# Patient Record
Sex: Female | Born: 1990 | Race: White | Hispanic: No | Marital: Single | Smoking: Current every day smoker
Health system: Southern US, Community
[De-identification: ages and names within clinical notes are randomized; demographics above are authoritative.]

## PROBLEM LIST (undated history)

## (undated) DIAGNOSIS — S82891A Other fracture of right lower leg, initial encounter for closed fracture: Secondary | ICD-10-CM

## (undated) DIAGNOSIS — F53 Postpartum depression: Secondary | ICD-10-CM

## (undated) DIAGNOSIS — O99345 Other mental disorders complicating the puerperium: Secondary | ICD-10-CM

## (undated) HISTORY — PX: TYMPANOSTOMY TUBE PLACEMENT: SHX32

## (undated) HISTORY — DX: Other mental disorders complicating the puerperium: O99.345

## (undated) HISTORY — PX: TONSILLECTOMY: SHX5217

## (undated) HISTORY — DX: Postpartum depression: F53.0

## (undated) HISTORY — PX: ANKLE FRACTURE SURGERY: SHX122

## (undated) HISTORY — DX: Other fracture of right lower leg, initial encounter for closed fracture: S82.891A

---
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
## 2010-05-14 ENCOUNTER — Observation Stay: Payer: Self-pay

## 2010-08-08 ENCOUNTER — Inpatient Hospital Stay: Payer: Self-pay

## 2011-08-12 ENCOUNTER — Emergency Department: Payer: Self-pay | Admitting: *Deleted

## 2011-08-12 LAB — PREGNANCY, URINE: Pregnancy Test, Urine: NEGATIVE m[IU]/mL

## 2011-08-12 LAB — URINALYSIS, COMPLETE
Ketone: NEGATIVE
Ph: 5 (ref 4.5–8.0)
Protein: NEGATIVE
Specific Gravity: 1.021 (ref 1.003–1.030)

## 2011-08-12 LAB — WET PREP, GENITAL

## 2012-08-20 IMAGING — US US PELV - US TRANSVAGINAL
1 series · 14 of 25 positions shown · non-contrast
Comparison: none

REASON FOR EXAM: llq pain
COMMENTS:   LMP: IUD

[Series 1: us pelv - us transvaginal · 0.26mm/px · 14 of 101 slices shown]
[im 1/101]
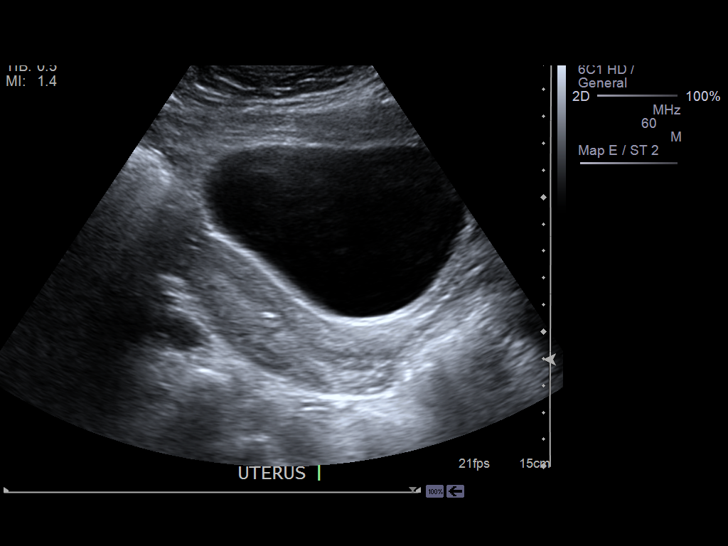
[im 9/101]
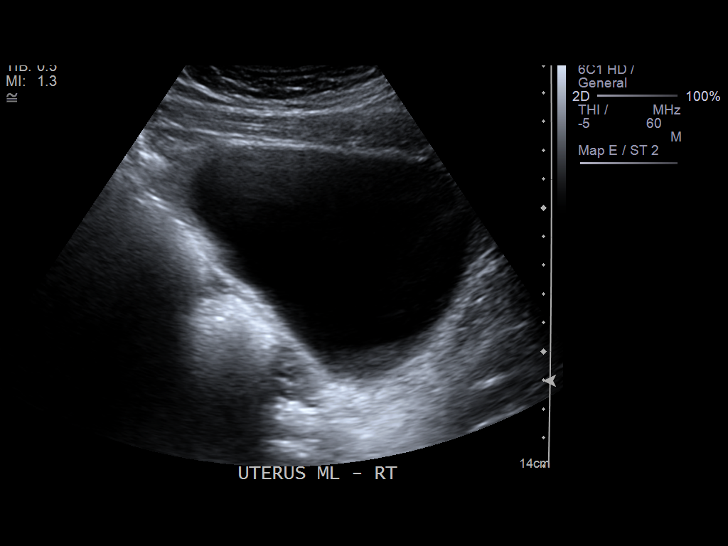
[im 17/101]
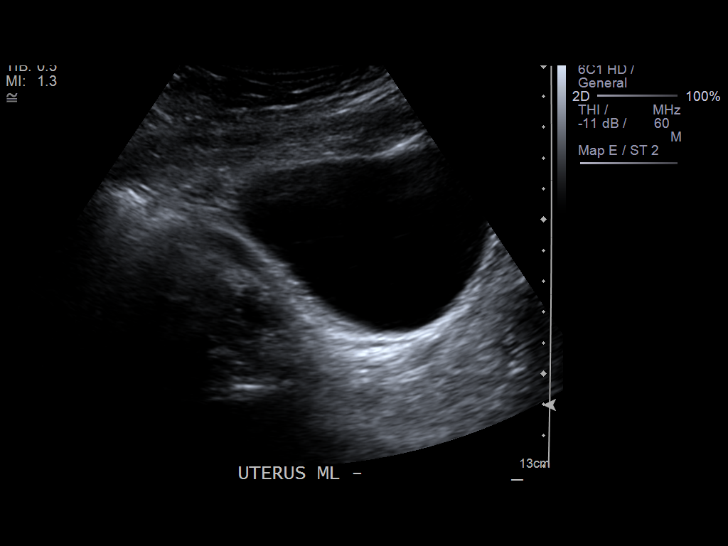
[im 26/101]
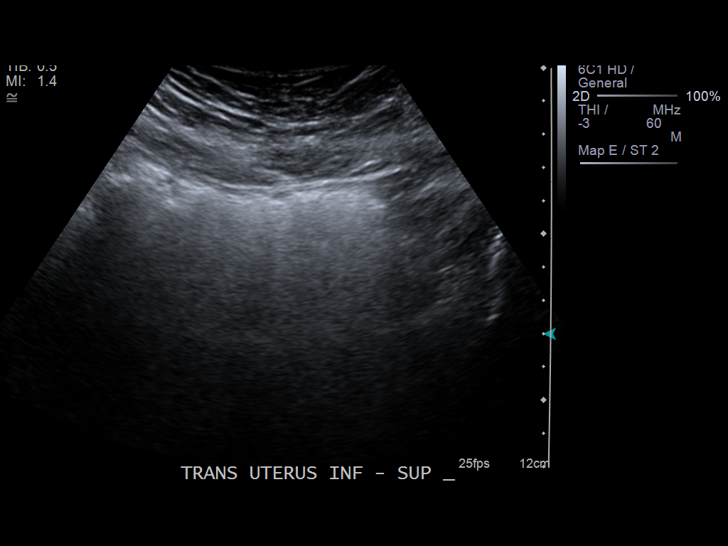
[im 34/101]
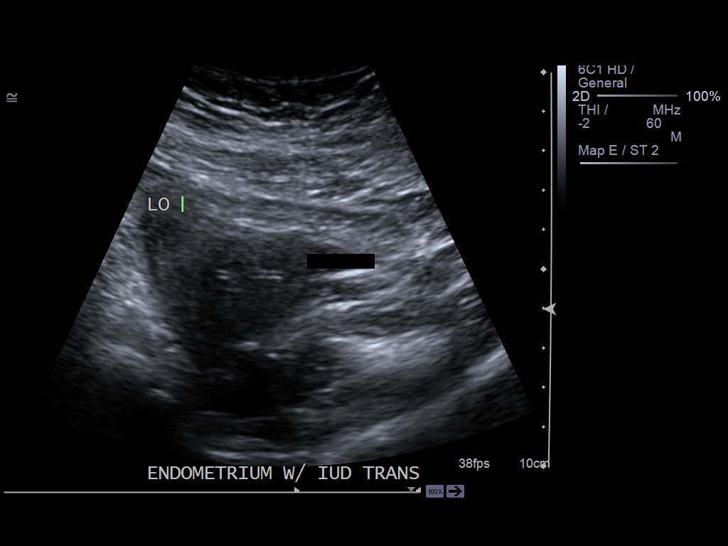
[im 38/101]
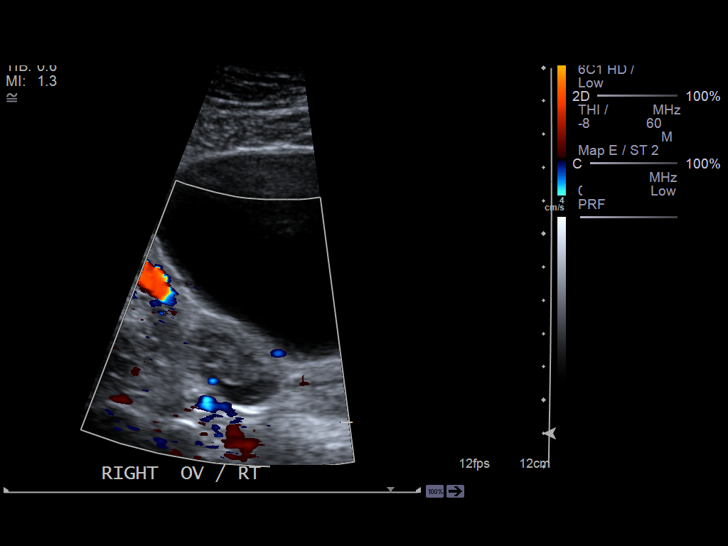
[im 46/101]
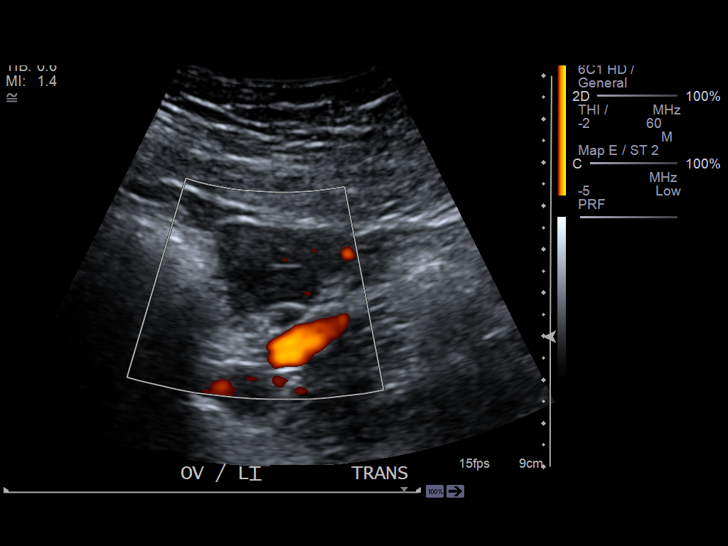
[im 55/101]
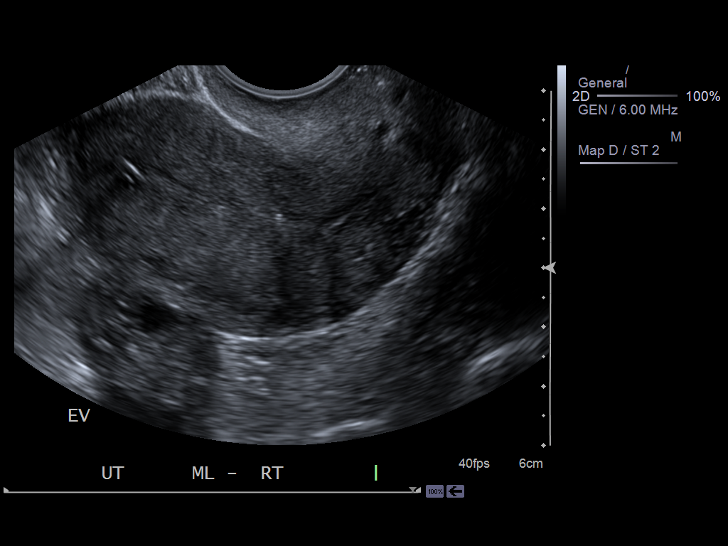
[im 63/101]
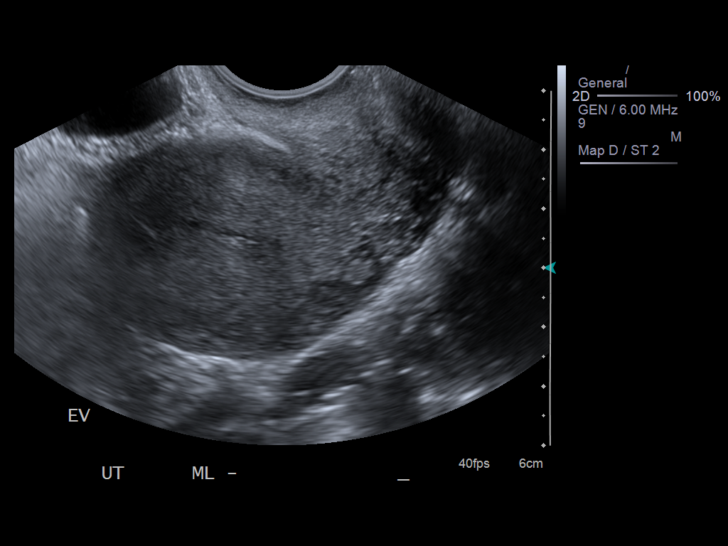
[im 67/101]
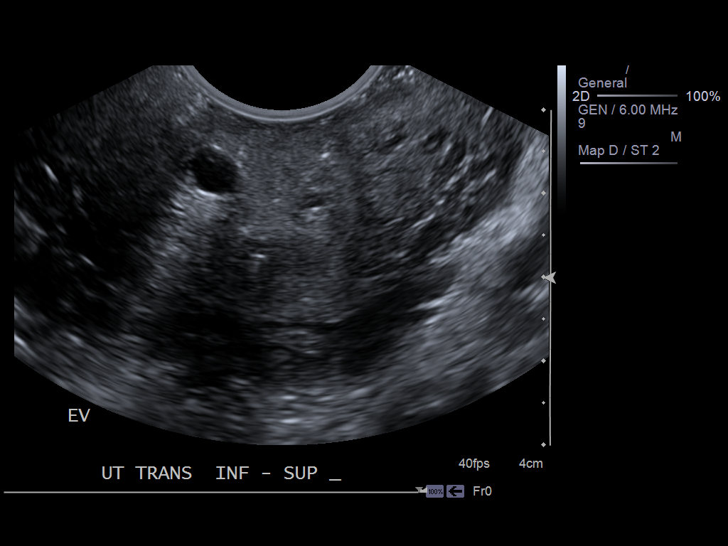
[im 76/101]
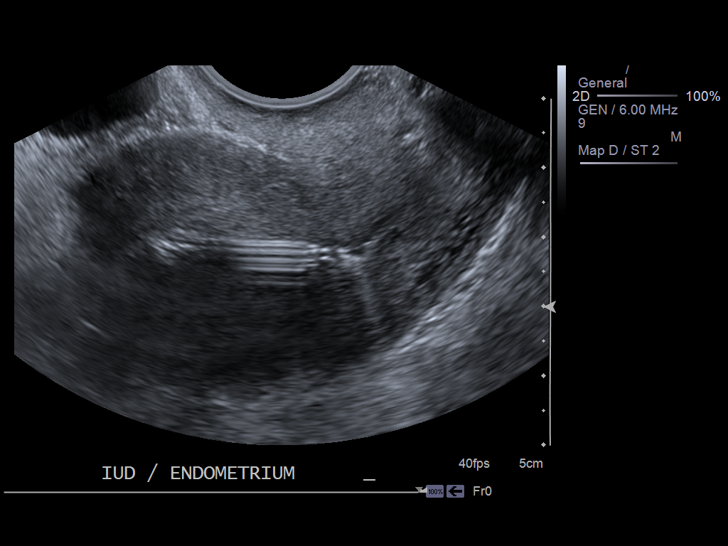
[im 84/101]
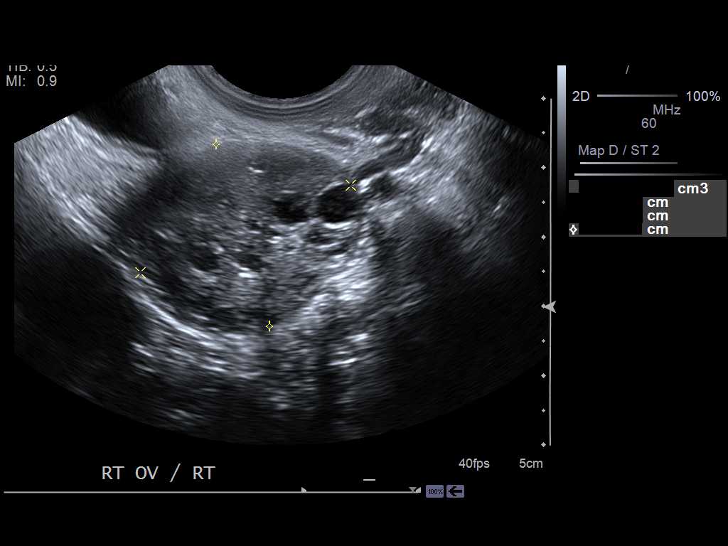
[im 92/101]
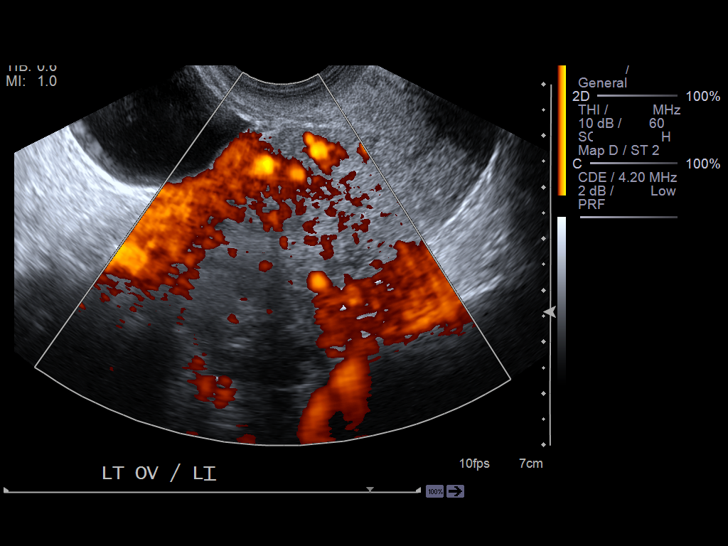
[im 101/101]
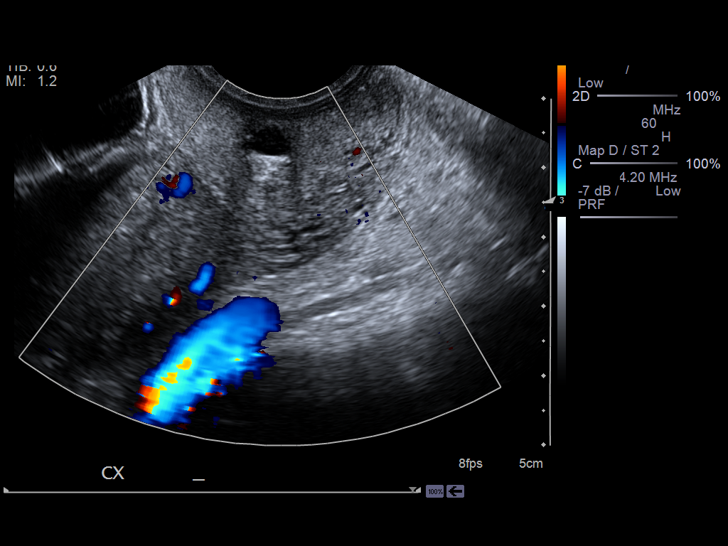

[14 of 25 positions shown; findings below may reference images not displayed]

PROCEDURE:     US  - US PELVIS EXAM W/TRANSVAGINAL  - August 12, 2011  [DATE]

RESULT:     The uterus exhibits normal echotexture and contains an IUD. The
uterus measures 8.8 x 4.6 x 2.6 cm. The endometrial stripe measures just
over 2 mm in thickness. There is no free fluid in the cul-de-sac. The right
ovary measures 3.5 x 3.2 x 1.6 cm. The left ovary measures 3.1 x 2.9 x 2 cm.
Survey views of the kidneys exhibit no evidence of obstruction.
IMPRESSION: 1. The uterus contains an IUD and is normal in echotexture and contour
2. The ovaries exhibit normal echotexture and vascularity. No adnexal masses
are demonstrated.

## 2013-07-07 ENCOUNTER — Emergency Department: Payer: Self-pay | Admitting: Emergency Medicine

## 2013-07-07 LAB — URINALYSIS, COMPLETE
BILIRUBIN, UR: NEGATIVE
Glucose,UR: NEGATIVE mg/dL (ref 0–75)
KETONE: NEGATIVE
NITRITE: NEGATIVE
PH: 6 (ref 4.5–8.0)
PROTEIN: NEGATIVE
RBC,UR: 1 /HPF (ref 0–5)
SPECIFIC GRAVITY: 1.023 (ref 1.003–1.030)
Squamous Epithelial: 13
WBC UR: 4 /HPF (ref 0–5)

## 2013-07-07 LAB — CBC
HCT: 40.2 % (ref 35.0–47.0)
HGB: 13.4 g/dL (ref 12.0–16.0)
MCH: 30.2 pg (ref 26.0–34.0)
MCHC: 33.2 g/dL (ref 32.0–36.0)
MCV: 91 fL (ref 80–100)
Platelet: 279 10*3/uL (ref 150–440)
RBC: 4.42 10*6/uL (ref 3.80–5.20)
RDW: 13.3 % (ref 11.5–14.5)
WBC: 14.2 10*3/uL — ABNORMAL HIGH (ref 3.6–11.0)

## 2013-07-07 LAB — HCG, QUANTITATIVE, PREGNANCY: Beta Hcg, Quant.: 100923 m[IU]/mL — ABNORMAL HIGH

## 2013-11-20 ENCOUNTER — Observation Stay: Payer: Self-pay | Admitting: Emergency Medicine

## 2014-02-17 ENCOUNTER — Inpatient Hospital Stay: Payer: Self-pay | Admitting: Obstetrics and Gynecology

## 2014-02-17 LAB — CBC WITH DIFFERENTIAL/PLATELET
BASOS ABS: 0.1 10*3/uL (ref 0.0–0.1)
Basophil %: 0.4 %
EOS ABS: 0.1 10*3/uL (ref 0.0–0.7)
Eosinophil %: 0.6 %
HCT: 39.7 % (ref 35.0–47.0)
HGB: 12.9 g/dL (ref 12.0–16.0)
LYMPHS PCT: 16.3 %
Lymphocyte #: 3.1 10*3/uL (ref 1.0–3.6)
MCH: 28.9 pg (ref 26.0–34.0)
MCHC: 32.4 g/dL (ref 32.0–36.0)
MCV: 89 fL (ref 80–100)
Monocyte #: 1.2 x10 3/mm — ABNORMAL HIGH (ref 0.2–0.9)
Monocyte %: 6.4 %
NEUTROS ABS: 14.5 10*3/uL — AB (ref 1.4–6.5)
Neutrophil %: 76.3 %
PLATELETS: 303 10*3/uL (ref 150–440)
RBC: 4.46 10*6/uL (ref 3.80–5.20)
RDW: 13.9 % (ref 11.5–14.5)
WBC: 19 10*3/uL — ABNORMAL HIGH (ref 3.6–11.0)

## 2014-02-17 LAB — GC/CHLAMYDIA PROBE AMP

## 2014-02-18 LAB — CBC WITH DIFFERENTIAL/PLATELET
BASOS PCT: 0.5 %
Basophil #: 0.1 10*3/uL (ref 0.0–0.1)
EOS PCT: 0.8 %
Eosinophil #: 0.2 10*3/uL (ref 0.0–0.7)
HCT: 37.2 % (ref 35.0–47.0)
HGB: 12.6 g/dL (ref 12.0–16.0)
LYMPHS ABS: 4.7 10*3/uL — AB (ref 1.0–3.6)
Lymphocyte %: 23.9 %
MCH: 29.6 pg (ref 26.0–34.0)
MCHC: 33.8 g/dL (ref 32.0–36.0)
MCV: 88 fL (ref 80–100)
MONOS PCT: 10.3 %
Monocyte #: 2 x10 3/mm — ABNORMAL HIGH (ref 0.2–0.9)
NEUTROS PCT: 64.5 %
Neutrophil #: 12.7 10*3/uL — ABNORMAL HIGH (ref 1.4–6.5)
Platelet: 275 10*3/uL (ref 150–440)
RBC: 4.25 10*6/uL (ref 3.80–5.20)
RDW: 13.8 % (ref 11.5–14.5)
WBC: 19.7 10*3/uL — AB (ref 3.6–11.0)

## 2014-02-19 LAB — HEMATOCRIT: HCT: 32.8 % — ABNORMAL LOW (ref 35.0–47.0)

## 2014-02-22 LAB — WOUND AEROBIC CULTURE

## 2014-05-30 NOTE — H&P (Signed)
L&D Evaluation:  History:  HPI 24yo G2P1001 @ 27week and 6 days by LMP with EDC of 02/12/14.  Patient presents today after a fall at 09:30 this morning. No trauma or direct hit to abodmen; landed on right hip.  No LOV VB CTX + FM.  Presented at 12:30 to triage after being cleared by the ED.  O+ per [facility] - prenatal labs unavailable for review at time of evaluation - will be faxed to L&D for records.   Presents with back pain, other, fall from standing   Patient's Medical History obesity   Patient's Surgical History none   Medications Pre Natal Vitamins   Allergies PCN   Social History none   Family History Non-Contributory   ROS:  ROS All systems were reviewed.  HEENT, CNS, GI, GU, Respiratory, CV, Renal and Musculoskeletal systems were found to be normal., back pain   Exam:  Vital Signs stable   Urine Protein not completed   General no apparent distress   Mental Status clear   Chest clear   Heart normal sinus rhythm   Abdomen gravid, non-tender   Estimated Fetal Weight n/a   Back no CVAT, no spinal point tenderness   Edema no edema   FHT normal rate with no decels, 130 +accels   Fetal Heart Rate 135   Ucx absent   Impression:  Impression other, monitor after fall   Plan:  Plan continue toco/efm for 3 hours   Comments Patient s/p fall, no trauma to abdomen.  No contractions, VB or LOF.  +FM.   Monitor for 6hours from incident - until 3:30pm.  If no contractions, decelerations, or indication of placental abruption, may discharge.  Keep next appointment.  If contractions begin, will continue to monitor for 24 hours.   Follow Up Appointment already scheduled   Electronic Signatures: Keona Sheffler, Elenora Fenderhelsea C (MD)  (Signed 01-Nov-15 14:06)  Authored: L&D Evaluation   Last Updated: 01-Nov-15 14:06 by Rexanne Inocencio, Elenora Fenderhelsea C (MD)

## 2014-07-16 IMAGING — US US OB < 14 WEEKS - US OB TV
1 series · 14 of 28 positions shown · non-contrast
Comparison: None.

CLINICAL DATA: 23-year-old with pelvic pain. Quantitative beta HCG
is [DATE]. By LMP the patient 8 weeks 4 days and has and EDC of
02/12/2014.

EXAM:
OBSTETRIC <14 WK ULTRASOUND
TECHNIQUE: Transabdominal ultrasound was performed for evaluation of the
gestation as well as the maternal uterus and adnexal regions.

[Series 1: us ob < 14 weeks - us ob tv · 0.22mm/px · 14 of 55 slices shown]
[im 3/55]
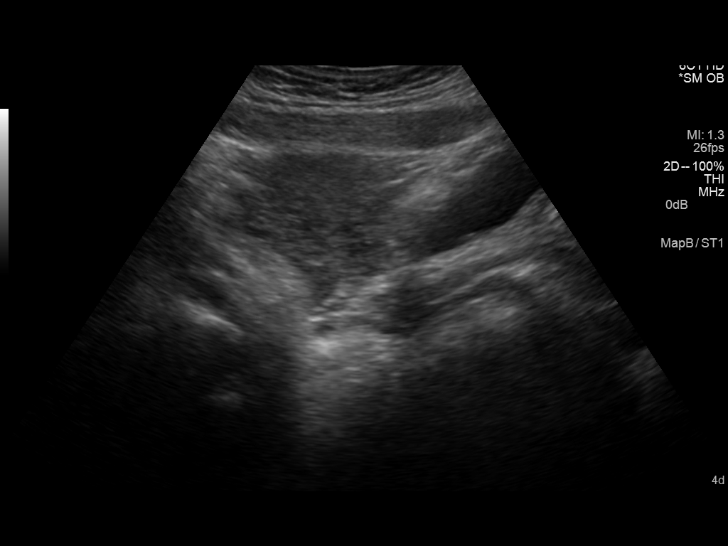
[im 7/55]
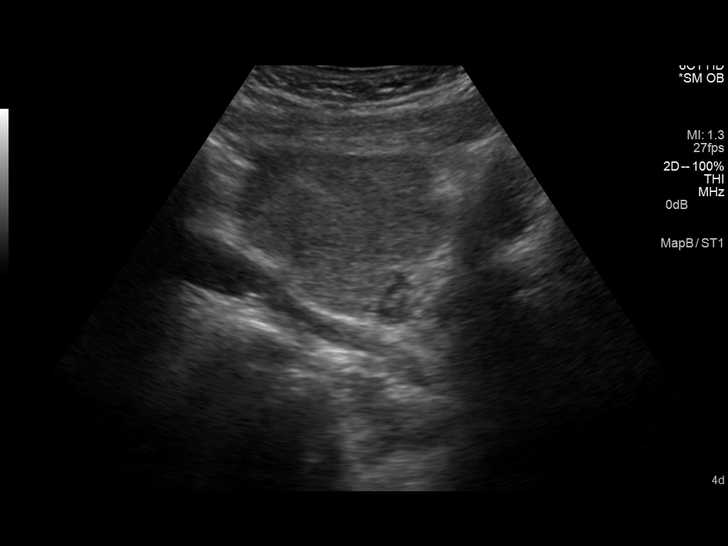
[im 11/55]
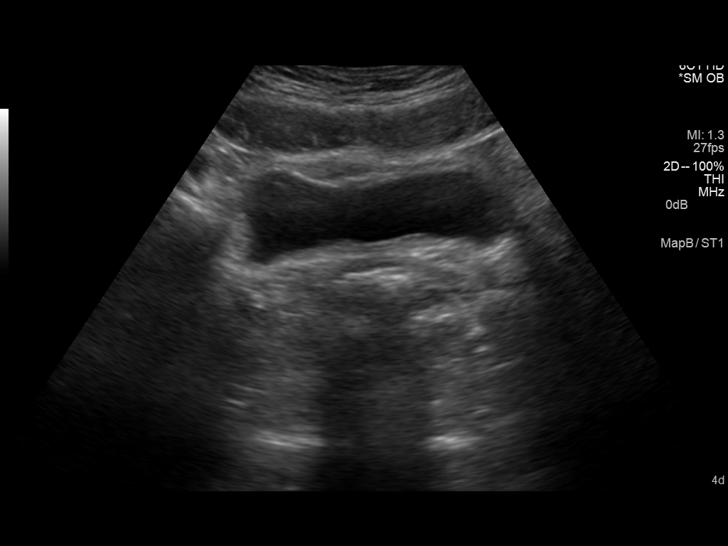
[im 15/55]
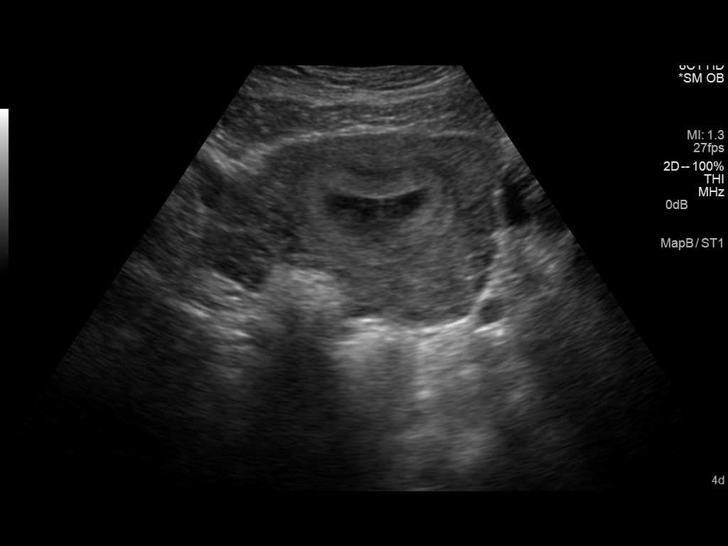
[im 19/55]
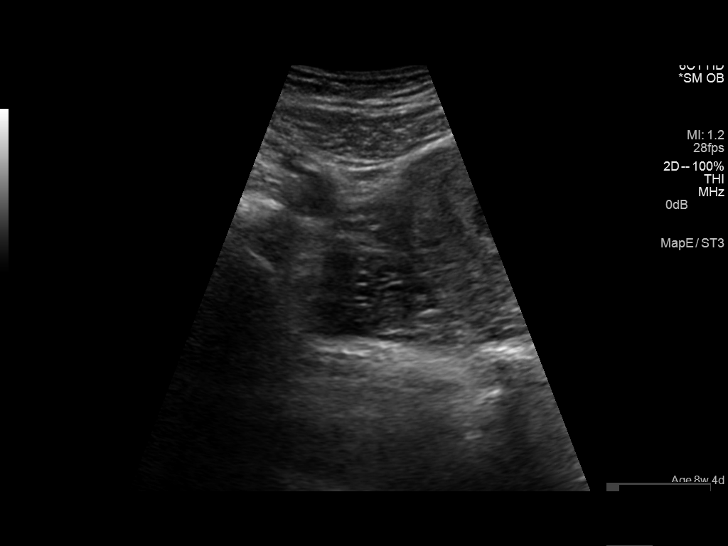
[im 23/55]
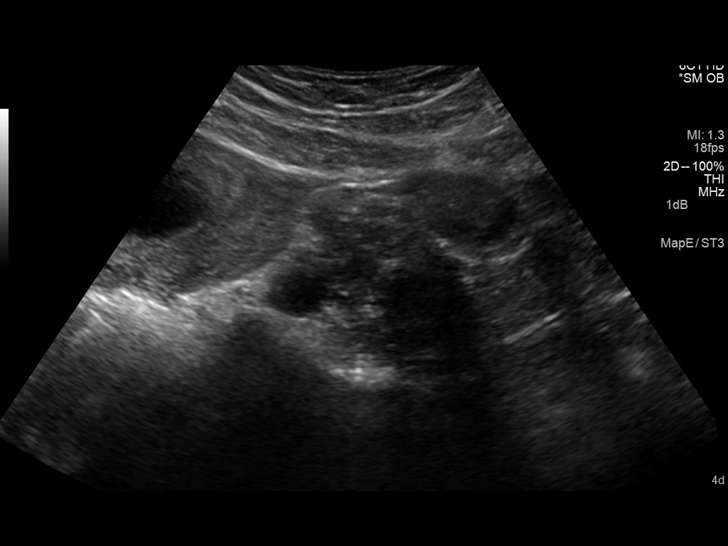
[im 27/55]
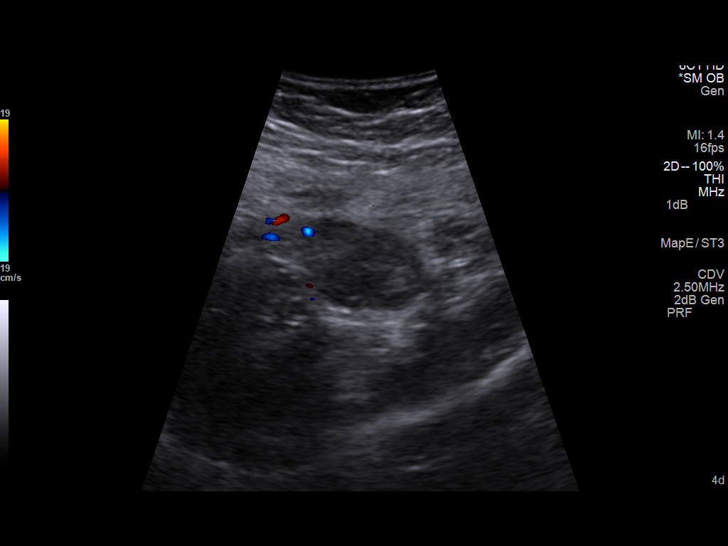
[im 31/55]
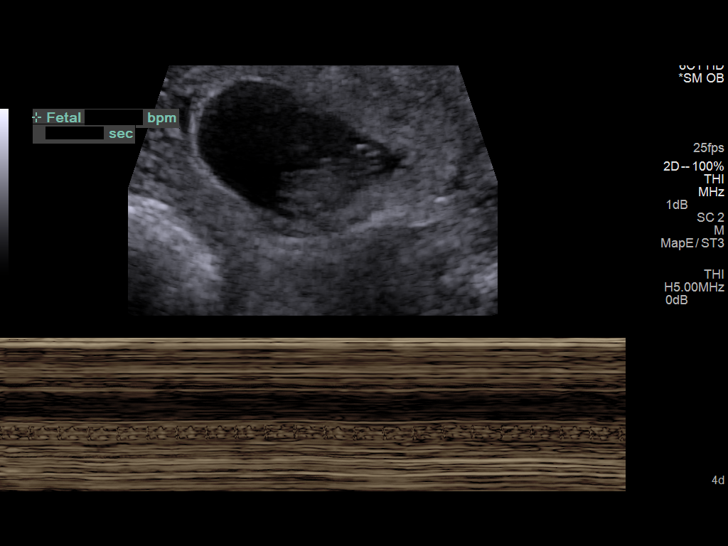
[im 35/55]
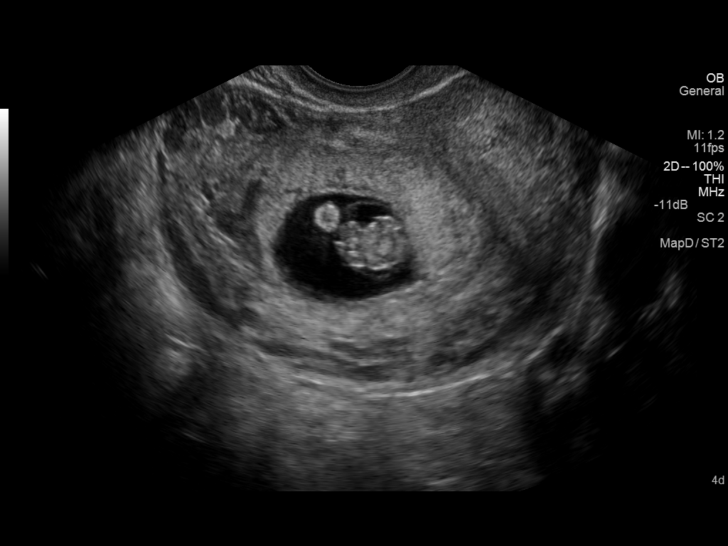
[im 39/55]
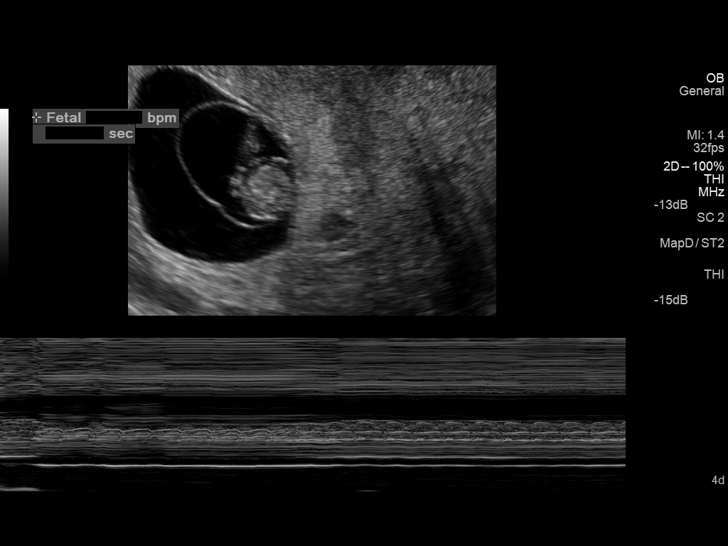
[im 43/55]
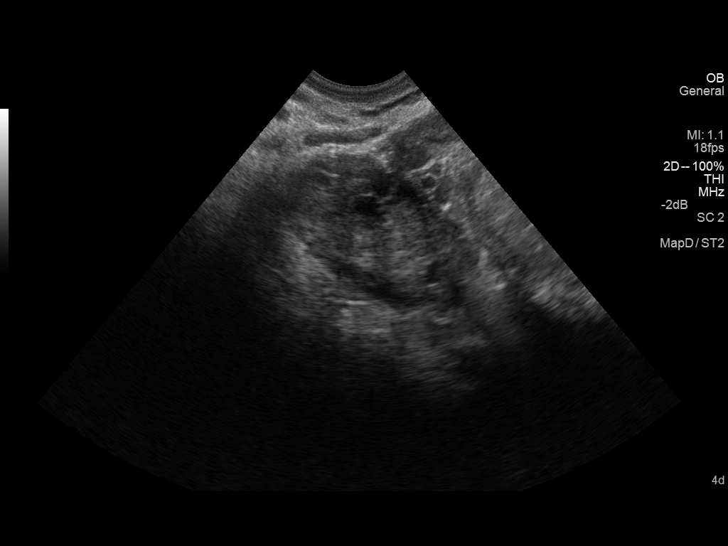
[im 47/55]
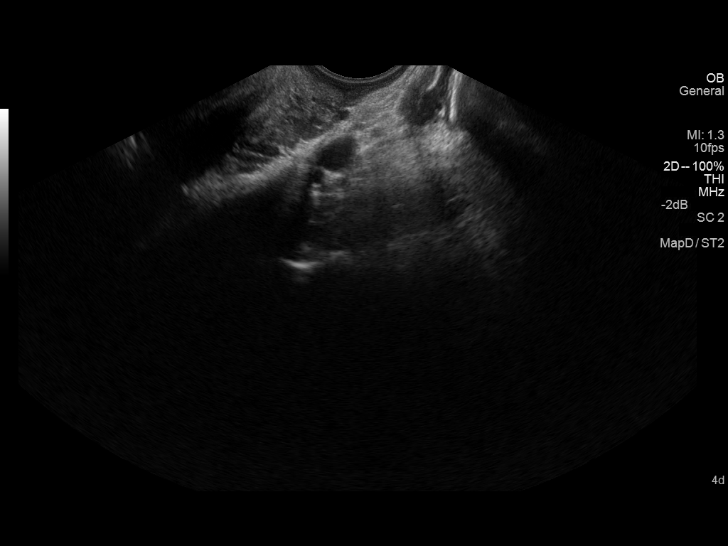
[im 51/55]
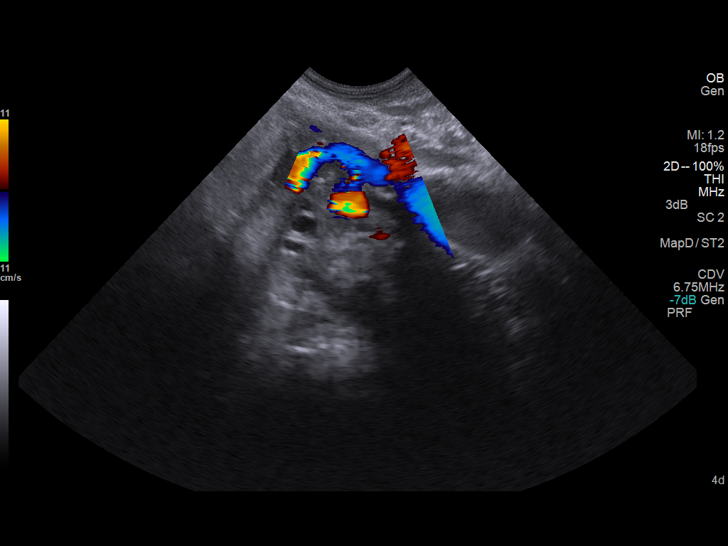
[im 55/55]
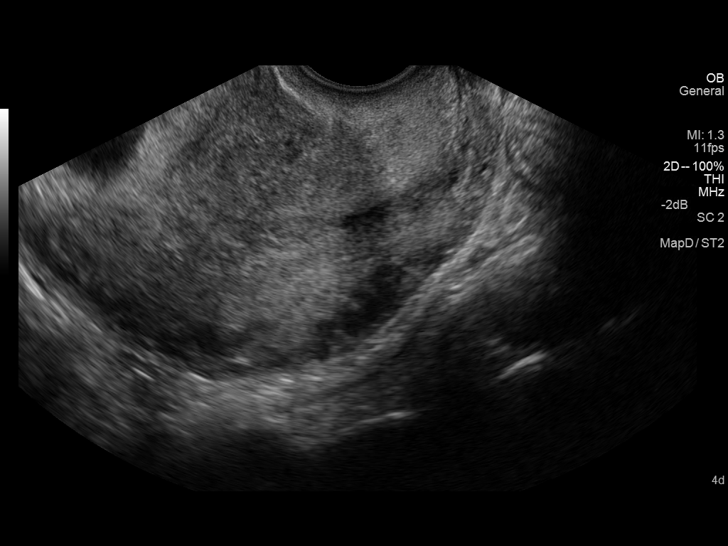

[14 of 28 positions shown; findings below may reference images not displayed]

FINDINGS: Intrauterine gestational sac: Visualized/normal in shape.

Yolk sac:  Present

Embryo:  Present

Cardiac Activity: Present

Heart Rate: 168 bpm

CRL:   18.2  mm   8 w 2d                  US EDC: 02/14/2014

Maternal uterus/adnexae: No subchorionic hemorrhage identified. The
ovaries have a normal appearance. No free pelvic fluid fluid.
IMPRESSION: 1. Single living intrauterine embryo at 8 weeks 2 days by
ultrasound.
2. Today's exam correlates well with clinical dating.
3. No evidence for ectopic pregnancy or other adnexal mass.

## 2015-01-21 NOTE — L&D Delivery Note (Signed)
Delivery Note Primary OB: No prenatal care Delivery Physician: Annamarie MajorPaul Kemani Demarais, MD Gestational Age: Full term Antepartum complications: No PNC Intrapartum complications: Precipitous labor  A viable Female was delivered via vertex perentation.  Apgars:8 ,9  Weight:  Not measured yet .   Placenta status: spontaneous and Intact.  Cord: 3+ vessels;  with the following complications: true knot.  Anesthesia:  none Episiotomy:  none Lacerations:  none Suture Repair: none Est. Blood Loss (mL):  less than 100 mL  Mom to postpartum.  Baby to Couplet care / Skin to Skin.  Annamarie MajorPaul Akirah Storck, MD Dept of OB/GYN [phone]

## 2015-08-08 ENCOUNTER — Encounter: Payer: Self-pay | Admitting: Family Medicine

## 2015-08-08 ENCOUNTER — Ambulatory Visit (INDEPENDENT_AMBULATORY_CARE_PROVIDER_SITE_OTHER): Payer: BC Managed Care – PPO | Admitting: Family Medicine

## 2015-08-08 VITALS — BP 117/79 | HR 92 | Temp 97.9°F | Ht 63.5 in | Wt 237.0 lb

## 2015-08-08 DIAGNOSIS — Z331 Pregnant state, incidental: Secondary | ICD-10-CM

## 2015-08-08 DIAGNOSIS — N912 Amenorrhea, unspecified: Secondary | ICD-10-CM | POA: Diagnosis not present

## 2015-08-08 DIAGNOSIS — Z3490 Encounter for supervision of normal pregnancy, unspecified, unspecified trimester: Secondary | ICD-10-CM

## 2015-08-08 LAB — PREGNANCY, URINE: Preg Test, Ur: POSITIVE — AB

## 2015-08-08 NOTE — Patient Instructions (Signed)
Follow up as needed

## 2015-08-08 NOTE — Progress Notes (Signed)
   BP 117/79 mmHg  Pulse 92  Temp(Src) 97.9 F (36.6 C)  Ht 5' 3.5" (1.613 m)  Wt 237 lb (107.502 kg)  BMI 41.32 kg/m2  SpO2 97%  LMP 06/01/2015 (Approximate)   Subjective:    Patient ID: Andrea Whitney, female    DOB: May 02, 1990, 25 y.o.   MRN: [id number]  HPI: Andrea Whitney is a 25 y.o. female   Chief Complaint  Patient presents with  . Pregnancy    She took a home pregnancy test, it was positive. Came here for verification.   Patient presents for pregnancy confirmation. Believes LMP was in May, but unsure - has had irregular periods since last year when she had her son. Spots frequently. Home pregnancy test was positive 3 weeks ago. States she uses condoms sometimes but not always.  Feels well overall, denies N/V. Some fatigue. Has spotted some since positive pregnancy test and is very nervous about that. Denies heavy bleeding or cramping.   20 lb weight gain over the past 15 months since having her second child.  Does not exercise or eat right. States she has a very busy life with her other two kids and job and knows she needs to take better care of herself.   Relevant past medical, surgical, family and social history reviewed and updated as indicated. Interim medical history since our last visit reviewed. Allergies and medications reviewed and updated.  Review of Systems  Constitutional: Positive for fatigue and unexpected weight change.  HENT: Negative.   Respiratory: Negative.   Cardiovascular: Negative.   Gastrointestinal: Negative.   Genitourinary: Positive for vaginal bleeding (spotting).  Musculoskeletal: Negative.   Skin: Negative.   Neurological: Negative.   Psychiatric/Behavioral: Negative.     Per HPI unless specifically indicated above     Objective:    BP 117/79 mmHg  Pulse 92  Temp(Src) 97.9 F (36.6 C)  Ht 5' 3.5" (1.613 m)  Wt 237 lb (107.502 kg)  BMI 41.32 kg/m2  SpO2 97%  LMP 06/01/2015 (Approximate)  Wt Readings from Last 3 Encounters:   08/08/15 237 lb (107.502 kg)  03/10/14 202 lb (91.627 kg)    Physical Exam  Constitutional: She is oriented to person, place, and time. She appears well-developed and well-nourished. No distress.  HENT:  Head: Atraumatic.  Eyes: Conjunctivae are normal. No scleral icterus.  Neck: Normal range of motion. Neck supple.  Cardiovascular: Normal rate and normal heart sounds.   Pulmonary/Chest: Effort normal.  Abdominal: Soft.  Musculoskeletal: Normal range of motion.  Neurological: She is alert and oriented to person, place, and time.  Skin: Skin is warm and dry.  Psychiatric: She has a normal mood and affect. Her behavior is normal.  Nursing note and vitals reviewed.      Assessment & Plan:   Problem List Items Addressed This Visit    None    Visit Diagnoses    Pregnancy    -  Primary    Relevant Orders    Ambulatory referral to Obstetrics / Gynecology    Amenorrhea        Relevant Orders    Pregnancy, urine      Referral sent to OB/GYN. Follow up as needed.    Follow up plan: Return if symptoms worsen or fail to improve.

## 2015-08-30 ENCOUNTER — Inpatient Hospital Stay
Admission: EM | Admit: 2015-08-30 | Discharge: 2015-09-02 | DRG: 774 | Disposition: A | Discharge status: Home / Self Care | Payer: Medicaid Other | Attending: Obstetrics & Gynecology | Admitting: Obstetrics & Gynecology

## 2015-08-30 ENCOUNTER — Encounter: Payer: Self-pay | Admitting: *Deleted

## 2015-08-30 DIAGNOSIS — Z3A38 38 weeks gestation of pregnancy: Secondary | ICD-10-CM | POA: Diagnosis not present

## 2015-08-30 DIAGNOSIS — O9882 Other maternal infectious and parasitic diseases complicating childbirth: Secondary | ICD-10-CM | POA: Diagnosis present

## 2015-08-30 DIAGNOSIS — A749 Chlamydial infection, unspecified: Secondary | ICD-10-CM | POA: Diagnosis present

## 2015-08-30 LAB — CBC
HCT: 36.9 % (ref 35.0–47.0)
Hemoglobin: 12.5 g/dL (ref 12.0–16.0)
MCH: 29 pg (ref 26.0–34.0)
MCHC: 33.9 g/dL (ref 32.0–36.0)
MCV: 85.6 fL (ref 80.0–100.0)
PLATELETS: 293 10*3/uL (ref 150–440)
RBC: 4.31 MIL/uL (ref 3.80–5.20)
RDW: 14.3 % (ref 11.5–14.5)
WBC: 20.7 10*3/uL — ABNORMAL HIGH (ref 3.6–11.0)

## 2015-08-30 MED ORDER — OXYTOCIN 10 UNIT/ML IJ SOLN
INTRAMUSCULAR | Status: AC
  Filled 2015-08-30: qty 2

## 2015-08-30 MED ORDER — LACTATED RINGERS IV SOLN: 500.0000 mL | INTRAVENOUS | Status: DC | PRN

## 2015-08-30 MED ORDER — OXYTOCIN 40 UNITS IN LACTATED RINGERS INFUSION - SIMPLE MED
2.5000 [IU]/h | INTRAVENOUS | Status: DC
  Administered 2015-08-31: 2.5 [IU]/h via INTRAVENOUS

## 2015-08-30 MED ORDER — LACTATED RINGERS IV SOLN
INTRAVENOUS | Status: DC
  Administered 2015-08-30: via INTRAVENOUS

## 2015-08-30 MED ORDER — LIDOCAINE HCL (PF) 1 % IJ SOLN
INTRAMUSCULAR | Status: AC
  Filled 2015-08-30: qty 30

## 2015-08-30 MED ORDER — ONDANSETRON HCL 4 MG/2ML IJ SOLN: 4.0000 mg | Freq: Four times a day (QID) | INTRAMUSCULAR | Status: DC | PRN

## 2015-08-30 MED ORDER — MISOPROSTOL 200 MCG PO TABS
ORAL_TABLET | ORAL | Status: AC
  Filled 2015-08-30: qty 4

## 2015-08-30 MED ORDER — BUTORPHANOL TARTRATE 1 MG/ML IJ SOLN: 1.0000 mg | INTRAMUSCULAR | Status: DC | PRN

## 2015-08-30 MED ORDER — AMMONIA AROMATIC IN INHA
RESPIRATORY_TRACT | Status: AC
  Filled 2015-08-30: qty 10

## 2015-08-30 MED ORDER — ACETAMINOPHEN 325 MG PO TABS: 650.0000 mg | ORAL_TABLET | ORAL | Status: DC | PRN

## 2015-08-30 MED ORDER — OXYTOCIN BOLUS FROM INFUSION
500.0000 mL | Freq: Once | INTRAVENOUS | Status: AC
  Administered 2015-08-31: 500 mL via INTRAVENOUS

## 2015-08-30 MED ORDER — CLINDAMYCIN PHOSPHATE 900 MG/50ML IV SOLN
900.0000 mg | Freq: Once | INTRAVENOUS | Status: AC
  Administered 2015-08-30: 900 mg via INTRAVENOUS

## 2015-08-30 NOTE — H&P (Signed)
Obstetrics Admission History & Physical   No chief complaint on file.   HPI:  25 y.o. No obstetric history on file. No PNC.  [facility] and was told she was 38 weeks by bedside US.  No known PNL. Admitted on 08/30/2015:    Presents for ctxs..  Prenatal care at: No prenatal care  PMHx:  Past Medical History:  Diagnosis Date  . Ankle fracture, right   . Post partum depression    PSHx:  Past Surgical History:  Procedure Laterality Date  . ANKLE FRACTURE SURGERY Right    screw placed  . TONSILLECTOMY    . TYMPANOSTOMY TUBE PLACEMENT Bilateral    Medications:  No prescriptions prior to admission.   Allergies: is allergic to penicillins and sulfur. OBHx:  OB History  NSVD x2, no complications   ZOX:WRUEAVWU/[id number]:Negative/unremarkable except as detailed in HPI. Soc Hx: Current smoker  Objective:  There were no vitals filed for this visit. General: Well nourished, well developed female in no acute distress.  Skin: Warm and dry.  Cardiovascular:Regular rate and rhythm. Respiratory: Clear to auscultation bilateral. Normal respiratory effort Abdomen: marked Neuro/Psych: Normal mood and affect.   Pelvic exam: is not limited by body habitus EGBUS: within normal limits Vagina: within normal limits and with normal mucosa blood in the vault Cervix: 7/80/-1 Uterus: Spontaneous uterine activity  Adnexa: not evaluated  EFM:FHR: 140 bpm, variability: moderate,  accelerations:  Present,  decelerations:  Absent Toco: Frequency: Every 2 minutes   Perinatal info:  Blood type: O positive Rubella- Unknown Varicella -Unknown TDaP tetanus status unknown to the patient  Assessment & Plan:   25 y.o. Admitted on 08/30/2015: G3 P2 at 38 weeks by US today, [facility] and delivery w painful ctxs and advanced dilation   Admit for labor, Antibiotics for GBS prophylaxis, Observe for cervical change and Fetal Wellbeing Reassuring

## 2015-08-31 ENCOUNTER — Encounter: Payer: Self-pay | Admitting: *Deleted

## 2015-08-31 LAB — CHLAMYDIA/NGC RT PCR (ARMC ONLY)
Chlamydia Tr: DETECTED — AB
N gonorrhoeae: NOT DETECTED

## 2015-08-31 LAB — CBC
HEMATOCRIT: 33.3 % — AB (ref 35.0–47.0)
HEMOGLOBIN: 11.4 g/dL — AB (ref 12.0–16.0)
MCH: 29.6 pg (ref 26.0–34.0)
MCHC: 34.3 g/dL (ref 32.0–36.0)
MCV: 86.3 fL (ref 80.0–100.0)
Platelets: 287 10*3/uL (ref 150–440)
RBC: 3.86 MIL/uL (ref 3.80–5.20)
RDW: 14.5 % (ref 11.5–14.5)
WBC: 28 10*3/uL — ABNORMAL HIGH (ref 3.6–11.0)

## 2015-08-31 LAB — TYPE AND SCREEN
ABO/RH(D): O POS
Antibody Screen: NEGATIVE

## 2015-08-31 LAB — BASIC METABOLIC PANEL
ANION GAP: 15 (ref 5–15)
BUN: 8 mg/dL (ref 6–20)
CALCIUM: 8.9 mg/dL (ref 8.9–10.3)
CO2: 15 mmol/L — ABNORMAL LOW (ref 22–32)
Chloride: 108 mmol/L (ref 101–111)
Creatinine, Ser: 0.61 mg/dL (ref 0.44–1.00)
Glucose, Bld: 89 mg/dL (ref 65–99)
Potassium: 3.4 mmol/L — ABNORMAL LOW (ref 3.5–5.1)
SODIUM: 138 mmol/L (ref 135–145)

## 2015-08-31 LAB — RAPID HIV SCREEN (HIV 1/2 AB+AG)
HIV 1/2 ANTIBODIES: NONREACTIVE
HIV-1 P24 Antigen - HIV24: NONREACTIVE

## 2015-08-31 MED ORDER — AZITHROMYCIN 250 MG PO TABS
1000.0000 mg | ORAL_TABLET | Freq: Once | ORAL | Status: AC
  Administered 2015-08-31: 1000 mg via ORAL
  Filled 2015-08-31: qty 4

## 2015-08-31 MED ORDER — SODIUM CHLORIDE 0.9% FLUSH: 3.0000 mL | INTRAVENOUS | Status: DC | PRN

## 2015-08-31 MED ORDER — SODIUM CHLORIDE 0.9% FLUSH: 3.0000 mL | Freq: Two times a day (BID) | INTRAVENOUS | Status: DC

## 2015-08-31 MED ORDER — DIPHENHYDRAMINE HCL 25 MG PO CAPS: 25.0000 mg | ORAL_CAPSULE | Freq: Four times a day (QID) | ORAL | Status: DC | PRN

## 2015-08-31 MED ORDER — TETANUS-DIPHTH-ACELL PERTUSSIS 5-2.5-18.5 LF-MCG/0.5 IM SUSP: 0.5000 mL | Freq: Once | INTRAMUSCULAR | Status: DC

## 2015-08-31 MED ORDER — SENNOSIDES-DOCUSATE SODIUM 8.6-50 MG PO TABS
2.0000 | ORAL_TABLET | ORAL | Status: DC
  Administered 2015-08-31 – 2015-09-01 (×2): 2 via ORAL
  Filled 2015-08-31 (×2): qty 2

## 2015-08-31 MED ORDER — SODIUM CHLORIDE 0.9 % IV SOLN: 250.0000 mL | INTRAVENOUS | Status: DC | PRN

## 2015-08-31 MED ORDER — OXYCODONE-ACETAMINOPHEN 5-325 MG PO TABS
1.0000 | ORAL_TABLET | ORAL | Status: DC | PRN
  Administered 2015-08-31: 1 via ORAL
  Filled 2015-08-31: qty 1

## 2015-08-31 MED ORDER — WITCH HAZEL-GLYCERIN EX PADS: 1.0000 "application " | MEDICATED_PAD | CUTANEOUS | Status: DC | PRN

## 2015-08-31 MED ORDER — OXYCODONE-ACETAMINOPHEN 5-325 MG PO TABS
2.0000 | ORAL_TABLET | ORAL | Status: DC | PRN
  Administered 2015-08-31 – 2015-09-02 (×11): 2 via ORAL
  Filled 2015-08-31 (×11): qty 2

## 2015-08-31 MED ORDER — BENZOCAINE-MENTHOL 20-0.5 % EX AERO
1.0000 "application " | INHALATION_SPRAY | CUTANEOUS | Status: DC | PRN
  Administered 2015-08-31: 1 via TOPICAL
  Filled 2015-08-31: qty 56

## 2015-08-31 MED ORDER — OXYTOCIN 40 UNITS IN LACTATED RINGERS INFUSION - SIMPLE MED
INTRAVENOUS | Status: AC
  Administered 2015-08-31: 500 mL via INTRAVENOUS
  Filled 2015-08-31: qty 1000

## 2015-08-31 MED ORDER — IBUPROFEN 600 MG PO TABS
600.0000 mg | ORAL_TABLET | Freq: Four times a day (QID) | ORAL | Status: DC
  Administered 2015-08-31 – 2015-09-02 (×10): 600 mg via ORAL
  Filled 2015-08-31 (×10): qty 1

## 2015-08-31 MED ORDER — SIMETHICONE 80 MG PO CHEW: 80.0000 mg | CHEWABLE_TABLET | ORAL | Status: DC | PRN

## 2015-08-31 MED ORDER — LORATADINE 10 MG PO TABS
10.0000 mg | ORAL_TABLET | Freq: Every day | ORAL | Status: DC
  Administered 2015-08-31 – 2015-09-02 (×3): 10 mg via ORAL
  Filled 2015-08-31 (×3): qty 1

## 2015-08-31 MED ORDER — DIBUCAINE 1 % RE OINT: 1.0000 "application " | TOPICAL_OINTMENT | RECTAL | Status: DC | PRN

## 2015-08-31 MED ORDER — ONDANSETRON HCL 4 MG PO TABS: 4.0000 mg | ORAL_TABLET | ORAL | Status: DC | PRN

## 2015-08-31 MED ORDER — COCONUT OIL OIL: 1.0000 "application " | TOPICAL_OIL | Status: DC | PRN

## 2015-08-31 MED ORDER — ZOLPIDEM TARTRATE 5 MG PO TABS: 5.0000 mg | ORAL_TABLET | Freq: Every evening | ORAL | Status: DC | PRN

## 2015-08-31 MED ORDER — ACETAMINOPHEN 325 MG PO TABS: 650.0000 mg | ORAL_TABLET | ORAL | Status: DC | PRN

## 2015-08-31 MED ORDER — ONDANSETRON HCL 4 MG/2ML IJ SOLN: 4.0000 mg | INTRAMUSCULAR | Status: DC | PRN

## 2015-08-31 NOTE — Care Management (Signed)
RNCM consult placed for potential need for carseat.  I have spoke with the bedside nurse who states that the patient may have financial support from family to obtain a carseat at the time of discharge.  I have informed the bedside nurse that as a last resort they do have options to provide patient with carset from hospital supply.

## 2015-08-31 NOTE — Progress Notes (Signed)
Chlamydia returned positive azithromycin ordered

## 2015-08-31 NOTE — Discharge Summary (Signed)
Obstetrical Discharge Summary  Date of Admission: 08/30/2015 Date of Discharge: 09/02/2015 Discharge Diagnosis: Term Pregnancy-delivered and No PNC Primary OB:  Other None   Gestational Age at Delivery: 945w1d  Antepartum complications: No PNC Date of Delivery: 08/31/2015  Delivered By: Annamarie MajorPaul Harris, MD Delivery Type: spontaneous vaginal delivery Intrapartum complications/course: Precipitous delivery Anesthesia: none Placenta: spontaneous Laceration: none Episiotomy: none Live born FEMALE Birth Weight:  3740 APGAR: ,8, 9  Post partum course: Since the delivery, patient has tolerate activity, diet, and daily functions without difficulty or complication.  Min lochia.  No breast concerns at this time.  No signs of depression currently.   Postpartum Exam:General appearance: alert, cooperative, appears stated age and no distress GI: Fundus firm Extremities: Homans sign is negative, no sign of DVT  Disposition: home with infant Rh Immune globulin given: not applicable Rubella vaccine given: not applicable Varicella vaccine given: not applicable Tdap vaccine given in AP or PP setting: yes- given postpartum Flu vaccine given in AP or PP setting: needed Contraception: to be determined at post partum visit  Prenatal Labs: O POS//Rubella Immune//RPR negative//HIV negative/HepB Surface Ag negative//plans to bottle feed  Plan:   Andrea Whitney was discharged to home in good condition. Make follow-up appointment with Dr Andrea Whitney for 6 weeks postpartum visit  Continue taking prenatal vitamin   Andrea Whitney, CNM

## 2015-09-01 LAB — HEPATITIS B SURFACE ANTIGEN: HEP B S AG: NEGATIVE

## 2015-09-01 LAB — RUBELLA SCREEN: Rubella: 1.89 index (ref >0.99)

## 2015-09-01 LAB — RPR: RPR: NONREACTIVE

## 2015-09-01 NOTE — Progress Notes (Addendum)
Post Partum Day 1  Subjective: Pt was sleeping earlier when first attempted rounds. Doing well, no complaints.  Tolerating regular diet, pain with PO meds, voiding and ambulating without difficulty.  No CP SOB F/C N/V or leg pain No HA, change of vision, RUQ/epigastric pain  Objective: BP (!) 137/54   Pulse 74   Temp 98.6 F (37 C) (Oral)   Resp 18   Ht 5\' 2"  (1.575 m)   Wt 108.9 kg (240 lb)   LMP 06/01/2015 (Approximate)   SpO2 99%   Bottle feeding   BMI 43.90 kg/m   Physical Exam:  General: NAD CV: RRR Pulm: nl effort, CTABL Lochia: moderate Uterine Fundus: fundus firm and below umbilicus DVT Evaluation: no cords, ttp LEs   Recent Labs  08/30/15 2330 08/31/15 0611  HGB 12.5 11.4*  HCT 36.9 33.3*  WBC 20.7* 28.0*  PLT 293 287   Assessment/Plan:  25 y.o. G3P3003 postpartum day # 1  1. Continue routine postpartum care  2. O+, RI, VI  3. TDAP status: needs prior to DC  4. Bottle feeding/plans BTL- knows to sign papers asap due to Medicaid status  5. Disposition: discharge to home tomorrow   Tresea MallGLEDHILL,Shane Badeaux, CNM

## 2015-09-01 NOTE — Clinical Social Work Maternal (Addendum)
  CLINICAL SOCIAL WORK MATERNAL/CHILD NOTE  Patient Details  Name: Park Popeshley L Polzin MRN: [id number] Date of Birth: [birth year]  Date:  09/01/2015  Clinical Social Worker Initiating Note:  Argentina PonderKaren Martha Sholonda Jobst MSW, LCSW-A Date/ Time Initiated:  09/01/15/1631     Child's Name:  Gerilyn PilgrimJacob   Legal Guardian:  Mother   Need for Interpreter:  None   Date of Referral:  09/01/15     Reason for Referral:  Current Substance Use/Substance Use During Pregnancy    Referral Source:  RN   Address:  [address], [place], [place] [address]  Phone number:  [phone]   Household Members:  Self, Minor Children, Media plannerDomestic Partner, Parents, Siblings   Natural Supports (not living in the home):  [employer], Chief of Staffeighbors   Professional Supports: Other (Comment) (Medicaid Caseworker)   Employment: Environmental education officerull-time   Type of Work: [employer] claims handlerGreek Restaurant, Systems developerGraham   Education:  Associate ProfessorHigh school graduate   Financial Resources:  Medicaid   Other Resources:  Sales executiveood Stamps , [employer]IC   Cultural/Religious Considerations Which May Impact Care: N/A  Strengths:  Ability to meet basic needs , Compliance with medical plan , Home prepared for child , Pediatrician chosen , Understanding of illness, Other (Comment) (Patient has multiple family and community supports)   Risk Factors/Current Problems:  Substance Use    Cognitive State:  Able to Concentrate , Alert , Goal Oriented    Mood/Affect:  Calm , Relaxed , Interested , Tearful  (Appropriate)   CSW Assessment: Patient was alert and willing to participate in the assessment. Patient discovered that she was pregnant in June due to repeated spotting up until then since giving birth 18 months ago. Patient took pregnancy test immediately, and contacted her doctor when she had a positive result. Not realizing how long she had been pregnant, patient not able to get appointment for care until last week. At that appointment, the patient found out that she was close to due date.  Patient gave birth the next day.  Patient reports social ETOH use and cannabis use during pregnancy, but not using cocaine to her knowledge. Patient reports that father of the child used cocaine in the past but not in the past year as far as she knew. Patient willing to discuss treatment options and became tearful when talking about how scared she was when finding out that she was pregnant for longer than she realized. Patient indicated that she is willing to do anything to ensure the health of her child.  Patient lives with her parents, 2 adult brothers, the father of the child, and her two minor children. Patient reports no ETOH or substance use witnessed by her children. The father (of all three children) is the primary caregiver while the patient works full-time.   CSW advised patient of mandated reporting protocol. Patient understood and verbalized compliance with protocol. Patient thanked the CSW for information and listening to the information.  CSW Plan/Description:  Child Protective Service Report CPS contacted 09/01/2015 and conducted CPS report assessment and care coordination for children referral assessment. CPS contact [place].    Judi CongKaren M Jelan Batterton, LCSW 09/01/2015, 4:36 PM

## 2015-09-02 MED ORDER — IBUPROFEN 600 MG PO TABS
600.0000 mg | ORAL_TABLET | Freq: Four times a day (QID) | ORAL | 0 refills | Status: DC
Start: 2015-09-02 — End: 2016-04-18

## 2015-09-02 MED ORDER — TETANUS-DIPHTH-ACELL PERTUSSIS 5-2.5-18.5 LF-MCG/0.5 IM SUSP
0.5000 mL | Freq: Once | INTRAMUSCULAR | 0 refills | Status: AC
Start: 2015-09-02 — End: 2015-09-02

## 2015-09-02 NOTE — Discharge Instructions (Signed)
Follow up sooner with fever, problems breathing, pain not helped by medications, severe depression( more than just baby blues, wanting to hurt yourself or the baby), severe bleeding ( saturating more than one pad an hour or large palm sized clots), no heavy lifting , no driving while taking narcotics, no douches, intercourse, tampons or enemas for 6 weeks Vaginal Delivery, Care After °Refer to this sheet in the next few weeks. These discharge instructions provide you with information on caring for yourself after delivery. Your health care provider may also give you specific instructions. Your treatment has been planned according to the most current medical practices available, but problems sometimes occur. Call your health care provider if you have any problems or questions after you go home. °HOME CARE INSTRUCTIONS °· Take over-the-counter or prescription medicines only as directed by your health care provider or pharmacist. °· Do not drink alcohol, especially if you are breastfeeding or taking medicine to relieve pain. °· Do not chew or smoke tobacco. °· Do not use illegal drugs. °· Continue to use good perineal care. Good perineal care includes: °¨ Wiping your perineum from front to back. °¨ Keeping your perineum clean. °· Do not use tampons or douche until your health care provider says it is okay. °· Shower, wash your hair, and take tub baths as directed by your health care provider. °· Wear a well-fitting bra that provides breast support. °· Eat healthy foods. °· Drink enough fluids to keep your urine clear or pale yellow. °· Eat high-fiber foods such as whole grain cereals and breads, brown rice, beans, and fresh fruits and vegetables every day. These foods may help prevent or relieve constipation. °· Follow your health care provider's recommendations regarding resumption of activities such as climbing stairs, driving, lifting, exercising, or traveling. °· Talk to your health care provider about resuming  sexual activities. Resumption of sexual activities is dependent upon your risk of infection, your rate of healing, and your comfort and desire to resume sexual activity. °· Try to have someone help you with your household activities and your newborn for at least a few days after you leave the hospital. °· Rest as much as possible. Try to rest or take a nap when your newborn is sleeping. °· Increase your activities gradually. °· Keep all of your scheduled postpartum appointments. It is very important to keep your scheduled follow-up appointments. At these appointments, your health care provider will be checking to make sure that you are healing physically and emotionally. °SEEK MEDICAL CARE IF:  °· You are passing large clots from your vagina. Save any clots to show your health care provider. °· You have a foul smelling discharge from your vagina. °· You have trouble urinating. °· You are urinating frequently. °· You have pain when you urinate. °· You have a change in your bowel movements. °· You have increasing redness, pain, or swelling near your vaginal incision (episiotomy) or vaginal tear. °· You have pus draining from your episiotomy or vaginal tear. °· Your episiotomy or vaginal tear is separating. °· You have painful, hard, or reddened breasts. °· You have a severe headache. °· You have blurred vision or see spots. °· You feel sad or depressed. °· You have thoughts of hurting yourself or your newborn. °· You have questions about your care, the care of your newborn, or medicines. °· You are dizzy or light-headed. °· You have a rash. °· You have nausea or vomiting. °· You were breastfeeding and have not had a   menstrual period within 12 weeks after you stopped breastfeeding.  You are not breastfeeding and have not had a menstrual period by the 12th week after delivery.  You have a fever. SEEK IMMEDIATE MEDICAL CARE IF:   You have persistent pain.  You have chest pain.  You have shortness of  breath.  You faint.  You have leg pain.  You have stomach pain.  Your vaginal bleeding saturates two or more sanitary pads in 1 hour.   This information is not intended to replace advice given to you by your health care provider. Make sure you discuss any questions you have with your health care provider.   Document Released: 01/04/2000 Document Revised: 09/27/2014 Document Reviewed: 09/03/2011 Elsevier Interactive Patient Education [employer]2016 Elsevier Inc.

## 2015-09-02 NOTE — Progress Notes (Signed)
All discharge instructions given to patient  and she voiced understanding of all instructions given.  She will make her own f/u appt and prescriptions given.  Patient discharged home with infant in her arms escorted out by cna in wheelchair

## 2015-09-02 NOTE — Progress Notes (Signed)
At 1400 patient passed a moderate clot with an area that looked like membrane or piece of the sac.  Andrea Whitney informed of this and that patient has no other bleeding and fundus is firm.  She said still fine to send patient home and that retained membranes don't usually cause issues the way retained placenta does. RN reassured patient

## 2015-09-03 LAB — SURGICAL PATHOLOGY

## 2016-01-21 NOTE — L&D Delivery Note (Signed)
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 Patient pushed to deliver a vigorous female infant in OA over an intact perineum. Baby dried, bulb suctioned and placed on the mother's abdomen. After delayed cord clamping the cord was cut by the patient. Baby weighed and given to patient's coach. Placenta delivered intact with trailing membranes and 3 vessel cord. Lower uterine segment atony resolved with massage, Pitocin IV and IM Methergine 0.2 mgm. EBL 450ml. No cervical or vaginal lacerations seen. Superficial periurethral noted-hemostatic and suturing not indicated. Anesthesia:    epidural Intrapartum complications: None GBS:    negative Laceration:    none Episiotomy:    none Placenta:    Via active 3rd stage. To pathology: no Estimated Blood Loss:  450 ml  Baby:    Liveborn female, Apgars 9/9, weight 7#3oz    Farrel Connersolleen Alynna Hargrove, CNM

## 2016-04-18 ENCOUNTER — Encounter: Payer: Self-pay | Admitting: Family Medicine

## 2016-04-18 ENCOUNTER — Ambulatory Visit (INDEPENDENT_AMBULATORY_CARE_PROVIDER_SITE_OTHER): Payer: BC Managed Care – PPO | Admitting: Family Medicine

## 2016-04-18 VITALS — BP 114/74 | HR 77 | Temp 97.7°F | Wt 239.0 lb

## 2016-04-18 DIAGNOSIS — Z3201 Encounter for pregnancy test, result positive: Secondary | ICD-10-CM

## 2016-04-18 LAB — PREGNANCY, URINE: Preg Test, Ur: POSITIVE — AB

## 2016-04-18 NOTE — Patient Instructions (Signed)
Follow up with [facility]

## 2016-04-18 NOTE — Progress Notes (Signed)
BP 114/74 (BP Location: Right Arm, Patient Position: Sitting, Cuff Size: Large)   Pulse 77   Temp 97.7 F (36.5 C)   Wt 239 lb (108.4 kg)   LMP 10/21/2015 (Within Weeks)   SpO2 97%   BMI 43.71 kg/m    Subjective:    Patient ID: Andrea Whitney, female    DOB: 12-24-1990, 26 y.o.   MRN: [id number]  HPI: Andrea Whitney is a 26 y.o. female  Chief Complaint  Patient presents with  . Possible Pregnancy    Last period October. Been on NuvaRing. Took at home pregnancy test in February, test was positive.   Patient presents for pregnancy confirmation today. Full term vaginal delivery in August 2017, started the nuva ring in September. LMP in October. Family member told her that she could change it out every 3 weeks and skip her cycles, so that is what she had done. Missed 2 weeks in January as she could not make it to the pharmacy, states she then had a 1 day period. Pos. Home pregnancy test x 2 in mid-February but thought it was d/t the nuva ring.   Has been occasionally remembering to take prenatal vitamins, and has reduced her smoking to less than 1/2 pack daily.  Some breast soreness, but otherwise no symptoms. Has gained about 10-20 lb the past few months.   Relevant past medical, surgical, family and social history reviewed and updated as indicated. Interim medical history since our last visit reviewed. Allergies and medications reviewed and updated.  Review of Systems  Constitutional: Positive for unexpected weight change.  HENT: Negative.   Eyes: Negative.   Respiratory: Negative.   Cardiovascular: Negative.   Gastrointestinal: Negative.   Genitourinary: Positive for menstrual problem.  Musculoskeletal: Negative.   Neurological: Negative.   Psychiatric/Behavioral: Negative.     Per HPI unless specifically indicated above     Objective:    BP 114/74 (BP Location: Right Arm, Patient Position: Sitting, Cuff Size: Large)   Pulse 77   Temp 97.7 F (36.5 C)   Wt 239  lb (108.4 kg)   LMP 10/21/2015 (Within Weeks)   SpO2 97%   BMI 43.71 kg/m   Wt Readings from Last 3 Encounters:  04/18/16 239 lb (108.4 kg)  08/30/15 240 lb (108.9 kg)  08/08/15 237 lb (107.5 kg)    Physical Exam  Constitutional: She is oriented to person, place, and time. She appears well-developed and well-nourished. No distress.  HENT:  Head: Atraumatic.  Eyes: Conjunctivae are normal. Pupils are equal, round, and reactive to light. No scleral icterus.  Neck: Normal range of motion. Neck supple.  Cardiovascular: Normal rate and normal heart sounds.   Pulmonary/Chest: Effort normal and breath sounds normal. No respiratory distress.  Abdominal: Bowel sounds are normal. There is no tenderness.  Musculoskeletal: Normal range of motion.  Neurological: She is alert and oriented to person, place, and time.  Skin: Skin is warm and dry.  Psychiatric: She has a normal mood and affect. Her behavior is normal.  Nursing note and vitals reviewed.     Assessment & Plan:   Problem List Items Addressed This Visit    None    Visit Diagnoses    Pregnancy test positive    -  Primary   Relevant Orders   Pregnancy, urine    Pt already established with [facility] from previous pregnancies. Discussed contacting them today to get set up with prenatal care and dating ultrasound. Discussed regular prenatal vitamins, alcohol  and smoking cessation, healthy eating, etc.    Follow up plan: Return if symptoms worsen or fail to improve.

## 2016-05-15 ENCOUNTER — Ambulatory Visit (INDEPENDENT_AMBULATORY_CARE_PROVIDER_SITE_OTHER): Payer: Medicaid Other | Admitting: Obstetrics and Gynecology

## 2016-05-15 ENCOUNTER — Encounter: Payer: Self-pay | Admitting: Obstetrics and Gynecology

## 2016-05-15 VITALS — BP 118/78 | Wt 242.0 lb

## 2016-05-15 DIAGNOSIS — O0932 Supervision of pregnancy with insufficient antenatal care, second trimester: Secondary | ICD-10-CM | POA: Insufficient documentation

## 2016-05-15 DIAGNOSIS — O99212 Obesity complicating pregnancy, second trimester: Secondary | ICD-10-CM

## 2016-05-15 DIAGNOSIS — O99211 Obesity complicating pregnancy, first trimester: Secondary | ICD-10-CM | POA: Insufficient documentation

## 2016-05-15 DIAGNOSIS — Z113 Encounter for screening for infections with a predominantly sexual mode of transmission: Secondary | ICD-10-CM

## 2016-05-15 DIAGNOSIS — F1490 Cocaine use, unspecified, uncomplicated: Secondary | ICD-10-CM

## 2016-05-15 DIAGNOSIS — Z131 Encounter for screening for diabetes mellitus: Secondary | ICD-10-CM

## 2016-05-15 DIAGNOSIS — F1410 Cocaine abuse, uncomplicated: Secondary | ICD-10-CM

## 2016-05-15 DIAGNOSIS — Z8759 Personal history of other complications of pregnancy, childbirth and the puerperium: Secondary | ICD-10-CM

## 2016-05-15 DIAGNOSIS — O0991 Supervision of high risk pregnancy, unspecified, first trimester: Secondary | ICD-10-CM | POA: Insufficient documentation

## 2016-05-15 DIAGNOSIS — O0992 Supervision of high risk pregnancy, unspecified, second trimester: Secondary | ICD-10-CM

## 2016-05-15 DIAGNOSIS — Z8659 Personal history of other mental and behavioral disorders: Secondary | ICD-10-CM

## 2016-05-15 DIAGNOSIS — O99322 Drug use complicating pregnancy, second trimester: Secondary | ICD-10-CM

## 2016-05-15 DIAGNOSIS — Z6841 Body Mass Index (BMI) 40.0 and over, adult: Secondary | ICD-10-CM | POA: Insufficient documentation

## 2016-05-15 DIAGNOSIS — Z3A25 25 weeks gestation of pregnancy: Secondary | ICD-10-CM

## 2016-05-15 NOTE — Progress Notes (Addendum)
New Obstetric Patient H&P   Chief Complaint: "Desires prenatal care"  History of Present Illness: Patient is a 26 y.o. G9P1003 Not Hispanic or Latino female, LMP unknown presents with amenorrhea and positive home pregnancy test. Based on her  LMP, her EDD is Estimated Date of Delivery: hard to calculate (story gives large range of possible due dates, but likely 12-15 weeks based solely on her report) and her EGA is Unknown. Cycles are absent due to continuous use of NuvaRing.  Her last pap smear, she can't remember, but states it wasn't abnormal   She had a urine pregnancy test which was positive 8 week(s)  ago. Her last menstrual period was normal and lasted for  1 day(s). Since her LMP she claims she has experienced no symptoms.  No bleeding. No fetal movement noted yet. She denies vaginal bleeding. Her past medical history is contibutory for obesity with BMI 44 today.  Her prior pregnancies are notable for largest baby was 8 lb 4 oz. Notably, she presented at 38 weeks for her initial prenatal visit in her last pregnancy.   Since her LMP, she admits to the use of tobacco products  no She claims she has gained   really no weight since the start of her pregnancy.  There are cats in the home in the home  Yes.  If yes Outdoor She admits close contact with children on a regular basis  yes  She has had chicken pox in the past yes She has had Tuberculosis exposures, symptoms, or previously tested positive for TB   no Current or past history of domestic violence. no  Genetic Screening/Teratology Counseling: (Includes patient, baby's father, or anyone in either family with:)   1. Patient's age >/= 50 at [facility]  no 2. Thalassemia ([place], [place], Mediterranean, or Asian background): MCV<80  no 3. Neural tube defect (meningomyelocele, spina bifida, anencephaly)  no 4. Congenital heart defect  no  5. Down syndrome  no 6. Tay-Sachs (Jewish, [place]  no 7. Canavan's Disease  no 8. Sickle cell  disease or trait (African)  no  9. Hemophilia or other blood disorders  no  10. Muscular dystrophy  no  11. Cystic fibrosis  no  12. Huntington's Chorea  no  13. Mental retardation/autism  no 14. Other inherited genetic or chromosomal disorder  no 15. Maternal metabolic disorder (DM, PKU, etc)  no 16. Patient or FOB with a child with a birth defect not listed above no  16a. Patient or FOB with a birth defect themselves no 17. Recurrent pregnancy loss, or stillbirth  no  18. Any medications since LMP other than prenatal vitamins (include vitamins, supplements, OTC meds, drugs, alcohol)  Yes. NuvaRing 19. Any other genetic/environmental exposure to discuss  no  Infection History:   1. Lives with someone with TB or TB exposed  no  2. Patient or partner has history of genital herpes  no 3. Rash or viral illness since LMP  no 4. History of STI (GC, CT, HPV, syphilis, HIV)  3 years ago, chlamydia, treated. 5. History of recent travel :  no  Other pertinent information:  no   Review of Systems  Constitutional: Negative.   HENT: Negative.   Eyes: Negative.   Respiratory: Negative.   Cardiovascular: Negative.   Gastrointestinal: Negative.   Genitourinary: Negative.   Musculoskeletal: Negative.   Skin: Negative.   Neurological: Negative.   Psychiatric/Behavioral: Negative.      Past Medical History:  Diagnosis Date  .  Ankle fracture, right   . Post partum depression     Past Surgical History:  Procedure Laterality Date  . ANKLE FRACTURE SURGERY Right    screw placed  . TONSILLECTOMY    . TYMPANOSTOMY TUBE PLACEMENT Bilateral     Gynecologic History: Patient's last menstrual period was 10/21/2015 (lmp unknown).  Obstetric History: G4P3003, SVD x 3, all uncomplicated.    Family History  Problem Relation Age of Onset  . Cancer Mother     skin  . Hypertension Father   . Cancer Maternal Grandmother   . Hypertension Paternal Grandmother   . Hypertension Paternal  Grandfather   . Heart disease Neg Hx   . Stroke Neg Hx     Social History   Social History  . Marital status: Single    Spouse name: N/A  . Number of children: N/A  . Years of education: N/A   Occupational History  . Not on file.   Social History Main Topics  . Smoking status: Current Every Day Smoker    Packs/day: 0.25    Types: Cigarettes  . Smokeless tobacco: Never Used  . Alcohol use No     Comment: occasional  . Drug use: No  . Sexual activity: Yes    Birth control/ protection: None   Other Topics Concern  . Not on file   Social History Narrative  . No narrative on file    Allergies  Allergen Reactions  . Penicillins Rash  . Sulfur Rash   Medications: Prior to Admission medications   none    Physical Exam BP 118/78   Wt 242 lb (109.8 kg)   LMP 10/21/2015 (LMP Unknown)   BMI 44.26 kg/m   Physical Exam  Constitutional: She is oriented to person, place, and time. She appears well-developed and well-nourished. No distress.  Genitourinary: Pelvic exam was performed with patient supine. There is no rash, tenderness, lesion or injury on the right labia. There is no rash, tenderness, lesion or injury on the left labia. No erythema, tenderness or bleeding in the vagina. No signs of injury around the vagina. No vaginal discharge found. Right adnexum does not display mass, does not display tenderness and does not display fullness. Left adnexum does not display mass, does not display tenderness and does not display fullness. Cervix does not exhibit motion tenderness or discharge.   Uterus is enlarged, mobile and anteverted. Uterus is not tender or exhibiting a mass.  Genitourinary Comments: Cervix is closed by digital exam  HENT:  Head: Normocephalic and atraumatic.  Eyes: EOM are normal. No scleral icterus.  Neck: Normal range of motion. Neck supple. No thyromegaly present.  Cardiovascular: Normal rate and regular rhythm.  Exam reveals no gallop and no friction  rub.   No murmur heard. Pulmonary/Chest: Effort normal and breath sounds normal. No respiratory distress. She has no wheezes. She has no rales. Right breast exhibits no inverted nipple, no mass, no nipple discharge, no skin change and no tenderness. Left breast exhibits no inverted nipple, no mass, no nipple discharge, no skin change and no tenderness.  Abdominal: Soft. Bowel sounds are normal. She exhibits no distension. There is no tenderness. There is no rebound and no guarding.  Enlarged uterus, palpating to several centimeters above umbilicus.  +Fetal heart tones heard to 145 bpm.    Musculoskeletal: Normal range of motion. She exhibits no edema or tenderness.  Lymphadenopathy:    She has no cervical adenopathy.       Right: No inguinal  adenopathy present.       Left: No inguinal adenopathy present.  Neurological: She is alert and oriented to person, place, and time. No cranial nerve deficit.  Skin: Skin is warm and dry. No rash noted. No erythema.  Psychiatric: She has a normal mood and affect. Her behavior is normal. Judgment normal.   Female Chaperone present for pelvic exam.  Bedside ultrasound performed by me: Single fetus Biometry consistent with [redacted]w[redacted]d GA with EDD 08/24/2016. (symmetric overall growth) Subjectively normal fluid Ovaries not visible due to body habitus and size of uterus.  Fetus in cephalic presentation Cervix appears closed and normal length Placenta anterior and fundal. Anatomy not specifically evaluated. FHR + at 147bpm  Assessment: 26 y.o. G4P1003 at [redacted]w[redacted]d gestational age presenting to initiate prenatal care  Plan: 1) Avoid alcoholic beverages. 2) Patient encouraged not to smoke.  3) Discontinue the use of all non-medicinal drugs and chemicals.  4) Take prenatal vitamins daily.  5) Nutrition, food safety (fish, cheese advisories, and high nitrite foods) and exercise discussed. 6) Hospital and practice style discussed with cross coverage system.  7)  Genetic Screening, such as with 1st Trimester Screening, cell free fetal DNA, AFP testing, and Ultrasound, as well as with amniocentesis and CVS as appropriate, is discussed with patient. At the conclusion of today's visit patient is not eligible for standard genetic screening tests. 8) Patient is asked about travel to areas at risk for the Zika virus, and counseled to avoid travel and exposure to mosquitoes or sexual partners who may have themselves been exposed to the virus. Testing is discussed, and will be ordered as appropriate.  9) anatomy u/s ordered along with prenatal labs and 1 hour glucose tolerance test, TSH.  10) Will get anatomy scan ASAP and in 2 weeks will have her come back for GDM testing and routine lab work.  She notes that she has never received the rhogam shot. In reviewing her records, she is O+.  She presented at 38 weeks during her last pregnancy.   11) for her obesity, will initiate the obesity protocol for BMI > 40. Recommend daily bASA. Growth u/s Q month in 3rd trimester, with NST/AFI testing starting at 36 weeks.  Thomasene Mohair, MD 05/15/2016 10:56 AM    ADDENDUM: Positive cocaine screen at NOB visit.  Will discuss w patient at follow up.

## 2016-05-19 DIAGNOSIS — O99322 Drug use complicating pregnancy, second trimester: Secondary | ICD-10-CM | POA: Insufficient documentation

## 2016-05-19 DIAGNOSIS — F1490 Cocaine use, unspecified, uncomplicated: Secondary | ICD-10-CM

## 2016-05-19 LAB — URINE DRUG PANEL 7
Amphetamines, Urine: NEGATIVE ng/mL
BENZODIAZEPINE QUANT UR: NEGATIVE ng/mL
Barbiturate Quant, Ur: NEGATIVE ng/mL
COCAINE (METAB.): POSITIVE — AB
Cannabinoid Quant, Ur: NEGATIVE ng/mL
OPIATE QUANT UR: NEGATIVE ng/mL
PCP QUANT UR: NEGATIVE ng/mL

## 2016-05-26 ENCOUNTER — Ambulatory Visit: Payer: Medicaid Other

## 2016-05-26 ENCOUNTER — Other Ambulatory Visit: Payer: Medicaid Other

## 2016-05-26 ENCOUNTER — Ambulatory Visit (INDEPENDENT_AMBULATORY_CARE_PROVIDER_SITE_OTHER): Payer: Medicaid Other | Admitting: Advanced Practice Midwife

## 2016-05-26 VITALS — BP 122/74 | Wt 242.0 lb

## 2016-05-26 DIAGNOSIS — A749 Chlamydial infection, unspecified: Secondary | ICD-10-CM

## 2016-05-26 DIAGNOSIS — Z0489 Encounter for examination and observation for other specified reasons: Secondary | ICD-10-CM

## 2016-05-26 DIAGNOSIS — O0932 Supervision of pregnancy with insufficient antenatal care, second trimester: Secondary | ICD-10-CM

## 2016-05-26 DIAGNOSIS — Z3A27 27 weeks gestation of pregnancy: Secondary | ICD-10-CM

## 2016-05-26 DIAGNOSIS — O0992 Supervision of high risk pregnancy, unspecified, second trimester: Secondary | ICD-10-CM

## 2016-05-26 DIAGNOSIS — IMO0002 Reserved for concepts with insufficient information to code with codable children: Secondary | ICD-10-CM

## 2016-05-26 DIAGNOSIS — O99212 Obesity complicating pregnancy, second trimester: Secondary | ICD-10-CM

## 2016-05-26 DIAGNOSIS — Z048 Encounter for examination and observation for other specified reasons: Secondary | ICD-10-CM

## 2016-05-26 DIAGNOSIS — O98812 Other maternal infectious and parasitic diseases complicating pregnancy, second trimester: Secondary | ICD-10-CM

## 2016-05-26 DIAGNOSIS — Z6841 Body Mass Index (BMI) 40.0 and over, adult: Secondary | ICD-10-CM

## 2016-05-26 MED ORDER — AZITHROMYCIN 500 MG PO TABS: 1000.0000 mg | ORAL_TABLET | Freq: Once | ORAL | 0 refills | Status: DC

## 2016-05-26 NOTE — Progress Notes (Signed)
Anatomy scan today

## 2016-05-26 NOTE — Progress Notes (Signed)
Anatomy scan today complete except sub-optimal for LVOT. Reviewed pt labs and found positive for chlamydia. Rx sent to pharm today and pt aware. Had 1 hr gtt today. c/o occasional RUQ pain especially after fried foods. Denies pain today.

## 2016-05-27 LAB — TSH: TSH: 0.663 u[IU]/mL (ref 0.450–4.500)

## 2016-05-27 LAB — RPR+RH+ABO+RUB AB+AB SCR+CB...
ANTIBODY SCREEN: NEGATIVE
HEMATOCRIT: 34.7 % (ref 34.0–46.6)
HEMOGLOBIN: 11.5 g/dL (ref 11.1–15.9)
HIV Screen 4th Generation wRfx: NONREACTIVE
Hepatitis B Surface Ag: NEGATIVE
MCH: 29.1 pg (ref 26.6–33.0)
MCHC: 33.1 g/dL (ref 31.5–35.7)
MCV: 88 fL (ref 79–97)
Platelets: 364 10*3/uL (ref 150–379)
RBC: 3.95 x10E6/uL (ref 3.77–5.28)
RDW: 14 % (ref 12.3–15.4)
RH TYPE: POSITIVE
RPR Ser Ql: NONREACTIVE
Rubella Antibodies, IGG: 2.55 index (ref >0.99)
Varicella zoster IgG: 636 index (ref >165)
WBC: 16.3 10*3/uL — ABNORMAL HIGH (ref 3.4–10.8)

## 2016-05-27 LAB — GLUCOSE, 1 HOUR GESTATIONAL: GESTATIONAL DIABETES SCREEN: 123 mg/dL (ref 65–139)

## 2016-06-11 ENCOUNTER — Encounter: Payer: Medicaid Other | Admitting: Obstetrics and Gynecology

## 2016-06-11 ENCOUNTER — Telehealth: Payer: Self-pay | Admitting: Advanced Practice Midwife

## 2016-06-11 ENCOUNTER — Other Ambulatory Visit: Payer: Medicaid Other

## 2016-06-11 NOTE — Telephone Encounter (Signed)
Patient was given prescription (patient does not know name) for STD at her last visit by Erskine SquibbJane, she states she took the med but threw it up.  Please call in another Rx.  Let J. Jarold Mottoatterson know so she can call patient.  Thanks!

## 2016-06-12 MED ORDER — AZITHROMYCIN 500 MG PO TABS: 1000.0000 mg | ORAL_TABLET | Freq: Once | ORAL | 0 refills | Status: AC

## 2016-06-12 NOTE — Progress Notes (Signed)
Rx for azithromycin resent due to patient throwing up first dose.

## 2016-06-12 NOTE — Addendum Note (Signed)
Addended by: Tresea MallGLEDHILL, Jayveon Convey on: 06/12/2016 09:22 AM   Modules accepted: Orders

## 2016-06-13 NOTE — Telephone Encounter (Signed)
Rx sent for azithromycin to pharmacy and patient notified

## 2016-06-23 ENCOUNTER — Ambulatory Visit (INDEPENDENT_AMBULATORY_CARE_PROVIDER_SITE_OTHER): Payer: Medicaid Other

## 2016-06-23 ENCOUNTER — Ambulatory Visit (INDEPENDENT_AMBULATORY_CARE_PROVIDER_SITE_OTHER): Payer: Medicaid Other | Admitting: Obstetrics & Gynecology

## 2016-06-23 VITALS — BP 120/70 | Wt 243.0 lb

## 2016-06-23 DIAGNOSIS — O99213 Obesity complicating pregnancy, third trimester: Secondary | ICD-10-CM

## 2016-06-23 DIAGNOSIS — Z048 Encounter for examination and observation for other specified reasons: Secondary | ICD-10-CM | POA: Diagnosis not present

## 2016-06-23 DIAGNOSIS — O0991 Supervision of high risk pregnancy, unspecified, first trimester: Secondary | ICD-10-CM

## 2016-06-23 DIAGNOSIS — Z0489 Encounter for examination and observation for other specified reasons: Secondary | ICD-10-CM

## 2016-06-23 DIAGNOSIS — IMO0002 Reserved for concepts with insufficient information to code with codable children: Secondary | ICD-10-CM

## 2016-06-23 DIAGNOSIS — Z23 Encounter for immunization: Secondary | ICD-10-CM

## 2016-06-23 DIAGNOSIS — Z3A31 31 weeks gestation of pregnancy: Secondary | ICD-10-CM

## 2016-06-23 NOTE — Patient Instructions (Signed)
Third Trimester of Pregnancy The third trimester is from week 28 through week 40 (months 7 through 9). The third trimester is a time when the unborn baby (fetus) is growing rapidly. At the end of the ninth month, the fetus is about 20 inches in length and weighs 6-10 pounds. Body changes during your third trimester Your body will continue to go through many changes during pregnancy. The changes vary from woman to woman. During the third trimester:  Your weight will continue to increase. You can expect to gain 25-35 pounds (11-16 kg) by the end of the pregnancy.  You may begin to get stretch marks on your hips, abdomen, and breasts.  You may urinate more often because the fetus is moving lower into your pelvis and pressing on your bladder.  You may develop or continue to have heartburn. This is caused by increased hormones that slow down muscles in the digestive tract.  You may develop or continue to have constipation because increased hormones slow digestion and cause the muscles that push waste through your intestines to relax.  You may develop hemorrhoids. These are swollen veins (varicose veins) in the rectum that can itch or be painful.  You may develop swollen, bulging veins (varicose veins) in your legs.  You may have increased body aches in the pelvis, back, or thighs. This is due to weight gain and increased hormones that are relaxing your joints.  You may have changes in your hair. These can include thickening of your hair, rapid growth, and changes in texture. Some women also have hair loss during or after pregnancy, or hair that feels dry or thin. Your hair will most likely return to normal after your baby is born.  Your breasts will continue to grow and they will continue to become tender. A yellow fluid (colostrum) may leak from your breasts. This is the first milk you are producing for your baby.  Your belly button may stick out.  You may notice more swelling in your hands,  face, or ankles.  You may have increased tingling or numbness in your hands, arms, and legs. The skin on your belly may also feel numb.  You may feel short of breath because of your expanding uterus.  You may have more problems sleeping. This can be caused by the size of your belly, increased need to urinate, and an increase in your body's metabolism.  You may notice the fetus "dropping," or moving lower in your abdomen (lightening).  You may have increased vaginal discharge.  You may notice your joints feel loose and you may have pain around your pelvic bone.  What to expect at prenatal visits You will have prenatal exams every 2 weeks until week 36. Then you will have weekly prenatal exams. During a routine prenatal visit:  You will be weighed to make sure you and the baby are growing normally.  Your blood pressure will be taken.  Your abdomen will be measured to track your baby's growth.  The fetal heartbeat will be listened to.  Any test results from the previous visit will be discussed.  You may have a cervical check near your due date to see if your cervix has softened or thinned (effaced).  You will be tested for Group B streptococcus. This happens between 35 and 37 weeks.  Your health care provider may ask you:  What your birth plan is.  How you are feeling.  If you are feeling the baby move.  If you have had   any abnormal symptoms, such as leaking fluid, bleeding, severe headaches, or abdominal cramping.  If you are using any tobacco products, including cigarettes, chewing tobacco, and electronic cigarettes.  If you have any questions.  Other tests or screenings that may be performed during your third trimester include:  Blood tests that check for low iron levels (anemia).  Fetal testing to check the health, activity level, and growth of the fetus. Testing is done if you have certain medical conditions or if there are problems during the  pregnancy.  Nonstress test (NST). This test checks the health of your baby to make sure there are no signs of problems, such as the baby not getting enough oxygen. During this test, a belt is placed around your belly. The baby is made to move, and its heart rate is monitored during movement.  What is false labor? False labor is a condition in which you feel small, irregular tightenings of the muscles in the womb (contractions) that usually go away with rest, changing position, or drinking water. These are called Braxton Hicks contractions. Contractions may last for hours, days, or even weeks before true labor sets in. If contractions come at regular intervals, become more frequent, increase in intensity, or become painful, you should see your health care provider. What are the signs of labor?  Abdominal cramps.  Regular contractions that start at 10 minutes apart and become stronger and more frequent with time.  Contractions that start on the top of the uterus and spread down to the lower abdomen and back.  Increased pelvic pressure and dull back pain.  A watery or bloody mucus discharge that comes from the vagina.  Leaking of amniotic fluid. This is also known as your "water breaking." It could be a slow trickle or a gush. Let your health care provider know if it has a color or strange odor. If you have any of these signs, call your health care provider right away, even if it is before your due date. Follow these instructions at home: Medicines  Follow your health care provider's instructions regarding medicine use. Specific medicines may be either safe or unsafe to take during pregnancy.  Take a prenatal vitamin that contains at least 600 micrograms (mcg) of folic acid.  If you develop constipation, try taking a stool softener if your health care provider approves. Eating and drinking  Eat a balanced diet that includes fresh fruits and vegetables, whole grains, good sources of protein  such as meat, eggs, or tofu, and low-fat dairy. Your health care provider will help you determine the amount of weight gain that is right for you.  Avoid raw meat and uncooked cheese. These carry germs that can cause birth defects in the baby.  If you have low calcium intake from food, talk to your health care provider about whether you should take a daily calcium supplement.  Eat four or five small meals rather than three large meals a day.  Limit foods that are high in fat and processed sugars, such as fried and sweet foods.  To prevent constipation: ? Drink enough fluid to keep your urine clear or pale yellow. ? Eat foods that are high in fiber, such as fresh fruits and vegetables, whole grains, and beans. Activity  Exercise only as directed by your health care provider. Most women can continue their usual exercise routine during pregnancy. Try to exercise for 30 minutes at least 5 days a week. Stop exercising if you experience uterine contractions.  Avoid heavy   lifting.  Do not exercise in extreme heat or humidity, or at high altitudes.  Wear low-heel, comfortable shoes.  Practice good posture.  You may continue to have sex unless your health care provider tells you otherwise. Relieving pain and discomfort  Take frequent breaks and rest with your legs elevated if you have leg cramps or low back pain.  Take warm sitz baths to soothe any pain or discomfort caused by hemorrhoids. Use hemorrhoid cream if your health care provider approves.  Wear a good support bra to prevent discomfort from breast tenderness.  If you develop varicose veins: ? Wear support pantyhose or compression stockings as told by your healthcare provider. ? Elevate your feet for 15 minutes, 3-4 times a day. Prenatal care  Write down your questions. Take them to your prenatal visits.  Keep all your prenatal visits as told by your health care provider. This is important. Safety  Wear your seat belt at  all times when driving.  Make a list of emergency phone numbers, including numbers for family, friends, the hospital, and police and fire departments. General instructions  Avoid cat litter boxes and soil used by cats. These carry germs that can cause birth defects in the baby. If you have a cat, ask someone to clean the litter box for you.  Do not travel far distances unless it is absolutely necessary and only with the approval of your health care provider.  Do not use hot tubs, steam rooms, or saunas.  Do not drink alcohol.  Do not use any products that contain nicotine or tobacco, such as cigarettes and e-cigarettes. If you need help quitting, ask your health care provider.  Do not use any medicinal herbs or unprescribed drugs. These chemicals affect the formation and growth of the baby.  Do not douche or use tampons or scented sanitary pads.  Do not cross your legs for long periods of time.  To prepare for the arrival of your baby: ? Take prenatal classes to understand, practice, and ask questions about labor and delivery. ? Make a trial run to the hospital. ? Visit the hospital and tour the maternity area. ? Arrange for maternity or paternity leave through employers. ? Arrange for family and friends to take care of pets while you are in the hospital. ? Purchase a rear-facing car seat and make sure you know how to install it in your car. ? Pack your hospital bag. ? Prepare the baby's nursery. Make sure to remove all pillows and stuffed animals from the baby's crib to prevent suffocation.  Visit your dentist if you have not gone during your pregnancy. Use a soft toothbrush to brush your teeth and be gentle when you floss. Contact a health care provider if:  You are unsure if you are in labor or if your water has broken.  You become dizzy.  You have mild pelvic cramps, pelvic pressure, or nagging pain in your abdominal area.  You have lower back pain.  You have persistent  nausea, vomiting, or diarrhea.  You have an unusual or bad smelling vaginal discharge.  You have pain when you urinate. Get help right away if:  Your water breaks before 37 weeks.  You have regular contractions less than 5 minutes apart before 37 weeks.  You have a fever.  You are leaking fluid from your vagina.  You have spotting or bleeding from your vagina.  You have severe abdominal pain or cramping.  You have rapid weight loss or weight gain.    You have shortness of breath with chest pain.  You notice sudden or extreme swelling of your face, hands, ankles, feet, or legs.  Your baby makes fewer than 10 movements in 2 hours.  You have severe headaches that do not go away when you take medicine.  You have vision changes. Summary  The third trimester is from week 28 through week 40, months 7 through 9. The third trimester is a time when the unborn baby (fetus) is growing rapidly.  During the third trimester, your discomfort may increase as you and your baby continue to gain weight. You may have abdominal, leg, and back pain, sleeping problems, and an increased need to urinate.  During the third trimester your breasts will keep growing and they will continue to become tender. A yellow fluid (colostrum) may leak from your breasts. This is the first milk you are producing for your baby.  False labor is a condition in which you feel small, irregular tightenings of the muscles in the womb (contractions) that eventually go away. These are called Braxton Hicks contractions. Contractions may last for hours, days, or even weeks before true labor sets in.  Signs of labor can include: abdominal cramps; regular contractions that start at 10 minutes apart and become stronger and more frequent with time; watery or bloody mucus discharge that comes from the vagina; increased pelvic pressure and dull back pain; and leaking of amniotic fluid. This information is not intended to replace advice  given to you by your health care provider. Make sure you discuss any questions you have with your health care provider. Document Released: 12/31/2000 Document Revised: 06/14/2015 Document Reviewed: 03/09/2012 Elsevier Interactive Patient Education  2017 [employer]  

## 2016-06-23 NOTE — Addendum Note (Signed)
Addended by: Cornelius MorasPATTERSON, Nazareth Kirk D on: 06/23/2016 01:57 PM   Modules accepted: Orders

## 2016-06-23 NOTE — Progress Notes (Signed)
US discussed, anat scan complete.  Growth scans monthly.  Obesity RF discussed. Labs discussed, normal glucola. UDS monthly, prior h/o cocaine.  (no urine specimen today) Desires BTL, discussed.  Papers signed.  May want complete salpingectomy and I advised this would be preferrably done at 6 weeks by lap.

## 2016-07-07 ENCOUNTER — Encounter: Payer: Medicaid Other | Admitting: Advanced Practice Midwife

## 2016-07-07 ENCOUNTER — Encounter: Payer: Medicaid Other | Admitting: Obstetrics and Gynecology

## 2016-07-15 ENCOUNTER — Encounter: Payer: Medicaid Other | Admitting: Certified Nurse Midwife

## 2016-07-21 ENCOUNTER — Ambulatory Visit (INDEPENDENT_AMBULATORY_CARE_PROVIDER_SITE_OTHER): Payer: Medicaid Other

## 2016-07-21 ENCOUNTER — Ambulatory Visit (INDEPENDENT_AMBULATORY_CARE_PROVIDER_SITE_OTHER): Payer: Medicaid Other | Admitting: Obstetrics & Gynecology

## 2016-07-21 VITALS — BP 120/80 | Wt 239.0 lb

## 2016-07-21 DIAGNOSIS — O0991 Supervision of high risk pregnancy, unspecified, first trimester: Secondary | ICD-10-CM

## 2016-07-21 DIAGNOSIS — Z6841 Body Mass Index (BMI) 40.0 and over, adult: Secondary | ICD-10-CM

## 2016-07-21 DIAGNOSIS — F1490 Cocaine use, unspecified, uncomplicated: Secondary | ICD-10-CM

## 2016-07-21 DIAGNOSIS — O99322 Drug use complicating pregnancy, second trimester: Secondary | ICD-10-CM

## 2016-07-21 DIAGNOSIS — Z3A35 35 weeks gestation of pregnancy: Secondary | ICD-10-CM

## 2016-07-21 DIAGNOSIS — Z362 Encounter for other antenatal screening follow-up: Secondary | ICD-10-CM

## 2016-07-21 DIAGNOSIS — O99211 Obesity complicating pregnancy, first trimester: Secondary | ICD-10-CM

## 2016-07-21 DIAGNOSIS — O99213 Obesity complicating pregnancy, third trimester: Secondary | ICD-10-CM | POA: Diagnosis not present

## 2016-07-21 DIAGNOSIS — F1410 Cocaine abuse, uncomplicated: Secondary | ICD-10-CM

## 2016-07-21 NOTE — Progress Notes (Signed)
US discussed.  UDS today.  GBS nv. BMI >=40 [ ]  Growth u/s 28 [x ], 32 [ x], 36 weeks [x ] [ ]  NST/AFI weekly 36+ weeks (36[] , 37[] , 38[] , 39[] , 40[] ) [ ]  IOL by 41 weeks (scheduled, prn [] )

## 2016-07-22 LAB — DRUG SCREEN, URINE
Amphetamines, Urine: NEGATIVE ng/mL
BARBITURATE SCREEN URINE: NEGATIVE ng/mL
BENZODIAZEPINE QUANT UR: NEGATIVE ng/mL
COCAINE (METAB.): POSITIVE ng/mL — AB
Cannabinoid Quant, Ur: NEGATIVE ng/mL
OPIATE QUANT UR: NEGATIVE ng/mL
PCP Quant, Ur: NEGATIVE ng/mL

## 2016-07-29 ENCOUNTER — Ambulatory Visit (INDEPENDENT_AMBULATORY_CARE_PROVIDER_SITE_OTHER): Payer: Medicaid Other

## 2016-07-29 ENCOUNTER — Ambulatory Visit (INDEPENDENT_AMBULATORY_CARE_PROVIDER_SITE_OTHER): Payer: Medicaid Other | Admitting: Advanced Practice Midwife

## 2016-07-29 VITALS — BP 126/68 | Wt 246.0 lb

## 2016-07-29 DIAGNOSIS — Z6841 Body Mass Index (BMI) 40.0 and over, adult: Secondary | ICD-10-CM

## 2016-07-29 DIAGNOSIS — Z3685 Encounter for antenatal screening for Streptococcus B: Secondary | ICD-10-CM

## 2016-07-29 DIAGNOSIS — O0991 Supervision of high risk pregnancy, unspecified, first trimester: Secondary | ICD-10-CM

## 2016-07-29 DIAGNOSIS — O0993 Supervision of high risk pregnancy, unspecified, third trimester: Secondary | ICD-10-CM | POA: Diagnosis not present

## 2016-07-29 DIAGNOSIS — O99211 Obesity complicating pregnancy, first trimester: Secondary | ICD-10-CM | POA: Diagnosis not present

## 2016-07-29 DIAGNOSIS — Z3A35 35 weeks gestation of pregnancy: Secondary | ICD-10-CM | POA: Diagnosis not present

## 2016-07-29 DIAGNOSIS — Z113 Encounter for screening for infections with a predominantly sexual mode of transmission: Secondary | ICD-10-CM

## 2016-07-29 DIAGNOSIS — Z3A36 36 weeks gestation of pregnancy: Secondary | ICD-10-CM

## 2016-07-29 DIAGNOSIS — Z362 Encounter for other antenatal screening follow-up: Secondary | ICD-10-CM | POA: Diagnosis not present

## 2016-07-29 DIAGNOSIS — O0990 Supervision of high risk pregnancy, unspecified, unspecified trimester: Secondary | ICD-10-CM

## 2016-07-29 NOTE — Progress Notes (Signed)
NST today reactive 130 bpm baseline Moderate variability +accelerations -decelerations AFI 14.1 GBS/aptima today No LOF, VB, having some BH ctx Denies recent cocaine use AFI/NST in 1 week

## 2016-07-29 NOTE — Progress Notes (Signed)
[facility]

## 2016-07-31 LAB — GC/CHLAMYDIA PROBE AMP
Chlamydia trachomatis, NAA: NEGATIVE
Neisseria gonorrhoeae by PCR: NEGATIVE

## 2016-08-02 LAB — STREP GP B CULTURE+RFLX: STREP GP B CULTURE+RFLX: NEGATIVE

## 2016-08-07 ENCOUNTER — Other Ambulatory Visit: Payer: Medicaid Other

## 2016-08-07 ENCOUNTER — Encounter: Payer: Medicaid Other | Admitting: Advanced Practice Midwife

## 2016-08-08 ENCOUNTER — Encounter: Payer: Medicaid Other | Admitting: Advanced Practice Midwife

## 2016-08-14 ENCOUNTER — Telehealth: Payer: Self-pay | Admitting: Obstetrics & Gynecology

## 2016-08-14 ENCOUNTER — Inpatient Hospital Stay: Payer: Medicaid Other | Admitting: Anesthesiology

## 2016-08-14 ENCOUNTER — Encounter: Payer: Self-pay | Admitting: *Deleted

## 2016-08-14 ENCOUNTER — Inpatient Hospital Stay
Admission: EM | Admit: 2016-08-14 | Discharge: 2016-08-15 | DRG: 767 | Disposition: A | Discharge status: Home / Self Care | Payer: Medicaid Other | Attending: Obstetrics and Gynecology | Admitting: Obstetrics and Gynecology

## 2016-08-14 ENCOUNTER — Encounter: Payer: Medicaid Other | Admitting: Obstetrics & Gynecology

## 2016-08-14 ENCOUNTER — Other Ambulatory Visit: Payer: Medicaid Other

## 2016-08-14 DIAGNOSIS — Z8759 Personal history of other complications of pregnancy, childbirth and the puerperium: Secondary | ICD-10-CM

## 2016-08-14 DIAGNOSIS — Z8659 Personal history of other mental and behavioral disorders: Secondary | ICD-10-CM

## 2016-08-14 DIAGNOSIS — Z880 Allergy status to penicillin: Secondary | ICD-10-CM | POA: Diagnosis not present

## 2016-08-14 DIAGNOSIS — Z882 Allergy status to sulfonamides status: Secondary | ICD-10-CM | POA: Diagnosis not present

## 2016-08-14 DIAGNOSIS — F1490 Cocaine use, unspecified, uncomplicated: Secondary | ICD-10-CM | POA: Diagnosis present

## 2016-08-14 DIAGNOSIS — F17210 Nicotine dependence, cigarettes, uncomplicated: Secondary | ICD-10-CM | POA: Diagnosis present

## 2016-08-14 DIAGNOSIS — O99322 Drug use complicating pregnancy, second trimester: Secondary | ICD-10-CM

## 2016-08-14 DIAGNOSIS — Z3A38 38 weeks gestation of pregnancy: Secondary | ICD-10-CM

## 2016-08-14 DIAGNOSIS — Z302 Encounter for sterilization: Secondary | ICD-10-CM

## 2016-08-14 DIAGNOSIS — Z09 Encounter for follow-up examination after completed treatment for conditions other than malignant neoplasm: Secondary | ICD-10-CM

## 2016-08-14 DIAGNOSIS — O99334 Smoking (tobacco) complicating childbirth: Secondary | ICD-10-CM | POA: Diagnosis present

## 2016-08-14 DIAGNOSIS — F17200 Nicotine dependence, unspecified, uncomplicated: Secondary | ICD-10-CM | POA: Diagnosis not present

## 2016-08-14 DIAGNOSIS — O99324 Drug use complicating childbirth: Secondary | ICD-10-CM | POA: Diagnosis present

## 2016-08-14 DIAGNOSIS — O0991 Supervision of high risk pregnancy, unspecified, first trimester: Secondary | ICD-10-CM

## 2016-08-14 DIAGNOSIS — Z3493 Encounter for supervision of normal pregnancy, unspecified, third trimester: Secondary | ICD-10-CM | POA: Diagnosis present

## 2016-08-14 DIAGNOSIS — O99211 Obesity complicating pregnancy, first trimester: Secondary | ICD-10-CM

## 2016-08-14 DIAGNOSIS — Z6841 Body Mass Index (BMI) 40.0 and over, adult: Secondary | ICD-10-CM

## 2016-08-14 LAB — CHLAMYDIA/NGC RT PCR (ARMC ONLY)
Chlamydia Tr: NOT DETECTED
N gonorrhoeae: NOT DETECTED

## 2016-08-14 LAB — URINE DRUG SCREEN, QUALITATIVE (ARMC ONLY)
AMPHETAMINES, UR SCREEN: NOT DETECTED
Barbiturates, Ur Screen: NOT DETECTED
Benzodiazepine, Ur Scrn: NOT DETECTED
CANNABINOID 50 NG, UR ~~LOC~~: NOT DETECTED
COCAINE METABOLITE, UR ~~LOC~~: NOT DETECTED
MDMA (ECSTASY) UR SCREEN: NOT DETECTED
Methadone Scn, Ur: NOT DETECTED
Opiate, Ur Screen: NOT DETECTED
Phencyclidine (PCP) Ur S: NOT DETECTED
Tricyclic, Ur Screen: NOT DETECTED

## 2016-08-14 LAB — CBC
HCT: 38.2 % (ref 35.0–47.0)
HEMOGLOBIN: 12.7 g/dL (ref 12.0–16.0)
MCH: 27.8 pg (ref 26.0–34.0)
MCHC: 33.1 g/dL (ref 32.0–36.0)
MCV: 84 fL (ref 80.0–100.0)
Platelets: 267 10*3/uL (ref 150–440)
RBC: 4.55 MIL/uL (ref 3.80–5.20)
RDW: 15.9 % — AB (ref 11.5–14.5)
WBC: 21.4 10*3/uL — AB (ref 3.6–11.0)

## 2016-08-14 LAB — TYPE AND SCREEN
ABO/RH(D): O POS
Antibody Screen: NEGATIVE

## 2016-08-14 LAB — GLUCOSE, CAPILLARY: GLUCOSE-CAPILLARY: 93 mg/dL (ref 65–99)

## 2016-08-14 MED ORDER — ONDANSETRON HCL 4 MG/2ML IJ SOLN
4.0000 mg | Freq: Four times a day (QID) | INTRAMUSCULAR | Status: DC | PRN
  Administered 2016-08-14: 4 mg via INTRAVENOUS

## 2016-08-14 MED ORDER — METHYLERGONOVINE MALEATE 0.2 MG/ML IJ SOLN
INTRAMUSCULAR | Status: AC
  Administered 2016-08-14: 0.2 mg
  Filled 2016-08-14: qty 1

## 2016-08-14 MED ORDER — OXYTOCIN BOLUS FROM INFUSION
500.0000 mL | Freq: Once | INTRAVENOUS | Status: AC
  Administered 2016-08-14: 500 mL via INTRAVENOUS

## 2016-08-14 MED ORDER — LACTATED RINGERS IV SOLN: INTRAVENOUS | Status: DC

## 2016-08-14 MED ORDER — LIDOCAINE-EPINEPHRINE (PF) 1.5 %-1:200000 IJ SOLN
INTRAMUSCULAR | Status: DC | PRN
  Administered 2016-08-14: 3 mL

## 2016-08-14 MED ORDER — PRENATAL MULTIVITAMIN CH
1.0000 | ORAL_TABLET | Freq: Every day | ORAL | Status: DC
  Administered 2016-08-14: 1 via ORAL
  Filled 2016-08-14 (×3): qty 1

## 2016-08-14 MED ORDER — LACTATED RINGERS IV SOLN: 500.0000 mL | INTRAVENOUS | Status: DC | PRN

## 2016-08-14 MED ORDER — SOD CITRATE-CITRIC ACID 500-334 MG/5ML PO SOLN: 30.0000 mL | ORAL | Status: DC | PRN

## 2016-08-14 MED ORDER — LACTATED RINGERS IV SOLN
INTRAVENOUS | Status: DC
  Administered 2016-08-14: 05:00:00 via INTRAVENOUS

## 2016-08-14 MED ORDER — LIDOCAINE HCL (PF) 1 % IJ SOLN
INTRAMUSCULAR | Status: AC
  Filled 2016-08-14: qty 30

## 2016-08-14 MED ORDER — LIDOCAINE HCL (PF) 1 % IJ SOLN: 30.0000 mL | INTRAMUSCULAR | Status: DC | PRN

## 2016-08-14 MED ORDER — EPHEDRINE 5 MG/ML INJ
10.0000 mg | INTRAVENOUS | Status: DC | PRN
  Filled 2016-08-14: qty 2

## 2016-08-14 MED ORDER — ACETAMINOPHEN 325 MG PO TABS
650.0000 mg | ORAL_TABLET | ORAL | Status: DC | PRN
  Administered 2016-08-14: 650 mg via ORAL
  Filled 2016-08-14: qty 2

## 2016-08-14 MED ORDER — OXYTOCIN 40 UNITS IN LACTATED RINGERS INFUSION - SIMPLE MED: 2.5000 [IU]/h | INTRAVENOUS | Status: DC

## 2016-08-14 MED ORDER — LACTATED RINGERS IV SOLN
500.0000 mL | INTRAVENOUS | Status: DC | PRN
  Administered 2016-08-14: 500 mL via INTRAVENOUS

## 2016-08-14 MED ORDER — OXYTOCIN BOLUS FROM INFUSION: 500.0000 mL | Freq: Once | INTRAVENOUS | Status: DC

## 2016-08-14 MED ORDER — FERROUS SULFATE 325 (65 FE) MG PO TABS: 325.0000 mg | ORAL_TABLET | Freq: Every day | ORAL | Status: DC

## 2016-08-14 MED ORDER — OXYCODONE HCL 5 MG PO TABS
10.0000 mg | ORAL_TABLET | ORAL | Status: DC | PRN
  Administered 2016-08-14 – 2016-08-15 (×6): 10 mg via ORAL
  Filled 2016-08-14 (×6): qty 2

## 2016-08-14 MED ORDER — BUTORPHANOL TARTRATE 1 MG/ML IJ SOLN
1.0000 mg | INTRAMUSCULAR | Status: DC | PRN
  Administered 2016-08-14: 1 mg via INTRAVENOUS

## 2016-08-14 MED ORDER — BENZOCAINE-MENTHOL 20-0.5 % EX AERO
1.0000 "application " | INHALATION_SPRAY | CUTANEOUS | Status: DC | PRN
  Administered 2016-08-14: 1 via TOPICAL
  Filled 2016-08-14: qty 56

## 2016-08-14 MED ORDER — OXYTOCIN 10 UNIT/ML IJ SOLN
INTRAMUSCULAR | Status: AC
  Filled 2016-08-14: qty 2

## 2016-08-14 MED ORDER — FENTANYL 2.5 MCG/ML W/ROPIVACAINE 0.15% IN NS 100 ML EPIDURAL (ARMC)
EPIDURAL | Status: AC
  Filled 2016-08-14: qty 100

## 2016-08-14 MED ORDER — IBUPROFEN 600 MG PO TABS
600.0000 mg | ORAL_TABLET | Freq: Four times a day (QID) | ORAL | Status: DC
  Administered 2016-08-14 – 2016-08-15 (×4): 600 mg via ORAL
  Filled 2016-08-14 (×4): qty 1

## 2016-08-14 MED ORDER — ONDANSETRON HCL 4 MG/2ML IJ SOLN
INTRAMUSCULAR | Status: AC
  Filled 2016-08-14: qty 2

## 2016-08-14 MED ORDER — MISOPROSTOL 200 MCG PO TABS
ORAL_TABLET | ORAL | Status: AC
  Filled 2016-08-14: qty 4

## 2016-08-14 MED ORDER — BUTORPHANOL TARTRATE 2 MG/ML IJ SOLN: 1.0000 mg | INTRAMUSCULAR | Status: DC | PRN

## 2016-08-14 MED ORDER — SODIUM CHLORIDE 0.9 % IV SOLN
INTRAVENOUS | Status: DC | PRN
  Administered 2016-08-14 (×3): 5 mL via EPIDURAL

## 2016-08-14 MED ORDER — OXYTOCIN 40 UNITS IN LACTATED RINGERS INFUSION - SIMPLE MED
INTRAVENOUS | Status: AC
  Filled 2016-08-14: qty 1000

## 2016-08-14 MED ORDER — ONDANSETRON HCL 4 MG/2ML IJ SOLN
4.0000 mg | INTRAMUSCULAR | Status: DC | PRN
Start: 2016-08-14 — End: 2016-08-15

## 2016-08-14 MED ORDER — ONDANSETRON HCL 4 MG/2ML IJ SOLN: 4.0000 mg | Freq: Four times a day (QID) | INTRAMUSCULAR | Status: DC | PRN

## 2016-08-14 MED ORDER — AMMONIA AROMATIC IN INHA
RESPIRATORY_TRACT | Status: AC
  Filled 2016-08-14: qty 10

## 2016-08-14 MED ORDER — LACTATED RINGERS IV SOLN
500.0000 mL | Freq: Once | INTRAVENOUS | Status: AC
  Administered 2016-08-14: 250 mL via INTRAVENOUS

## 2016-08-14 MED ORDER — DIBUCAINE 1 % RE OINT
1.0000 | TOPICAL_OINTMENT | RECTAL | Status: DC | PRN
Start: 2016-08-14 — End: 2016-08-15

## 2016-08-14 MED ORDER — ACETAMINOPHEN 325 MG PO TABS: 650.0000 mg | ORAL_TABLET | ORAL | Status: DC | PRN

## 2016-08-14 MED ORDER — PHENYLEPHRINE 40 MCG/ML (10ML) SYRINGE FOR IV PUSH (FOR BLOOD PRESSURE SUPPORT)
80.0000 ug | PREFILLED_SYRINGE | INTRAVENOUS | Status: DC | PRN
  Filled 2016-08-14: qty 5

## 2016-08-14 MED ORDER — SIMETHICONE 80 MG PO CHEW: 80.0000 mg | CHEWABLE_TABLET | ORAL | Status: DC | PRN

## 2016-08-14 MED ORDER — FENTANYL 2.5 MCG/ML W/ROPIVACAINE 0.15% IN NS 100 ML EPIDURAL (ARMC)
12.0000 mL/h | EPIDURAL | Status: DC
  Administered 2016-08-14: 12 mL/h via EPIDURAL

## 2016-08-14 MED ORDER — WITCH HAZEL-GLYCERIN EX PADS: 1.0000 "application " | MEDICATED_PAD | CUTANEOUS | Status: DC | PRN

## 2016-08-14 MED ORDER — SENNOSIDES-DOCUSATE SODIUM 8.6-50 MG PO TABS: 2.0000 | ORAL_TABLET | ORAL | Status: DC

## 2016-08-14 MED ORDER — DIPHENHYDRAMINE HCL 50 MG/ML IJ SOLN: 12.5000 mg | INTRAMUSCULAR | Status: DC | PRN

## 2016-08-14 MED ORDER — BUTORPHANOL TARTRATE 2 MG/ML IJ SOLN
INTRAMUSCULAR | Status: AC
  Administered 2016-08-14: 1 mg via INTRAVENOUS
  Filled 2016-08-14: qty 1

## 2016-08-14 MED ORDER — OXYCODONE HCL 5 MG PO TABS: 5.0000 mg | ORAL_TABLET | ORAL | Status: DC | PRN

## 2016-08-14 MED ORDER — COCONUT OIL OIL: 1.0000 "application " | TOPICAL_OIL | Status: DC | PRN

## 2016-08-14 MED ORDER — ONDANSETRON HCL 4 MG PO TABS
4.0000 mg | ORAL_TABLET | ORAL | Status: DC | PRN
  Administered 2016-08-15: 4 mg via ORAL
  Filled 2016-08-14: qty 1

## 2016-08-14 NOTE — Progress Notes (Signed)
Subjective:    Objective:   Vitals: Blood pressure (!) 98/56, pulse 66, temperature 98.3 F (36.8 C), temperature source Oral, resp. rate 18, height 5\' 2"  (1.575 m), weight 244 lb (110.7 kg), last menstrual period 10/21/2015, SpO2 96 %, unknown if currently breastfeeding. General:  Abdomen: Cervical Exam:  Dilation: Lip/rim Effacement (%): 100 Cervical Position: Anterior Station: -1 Presentation: Vertex Exam by:: Keelyn Fjelstad MD  FHT: 120, moderate, +accels, no decels Toco: q2-225min not picking up well on Toco  Results for orders placed or performed during the hospital encounter of 08/14/16 (from the past 24 hour(s))  Urine Drug Screen, Qualitative ([facility] only)     Status: None   Collection Time: 08/14/16  4:23 AM  Result Value Ref Range   Tricyclic, Ur Screen NONE DETECTED NONE DETECTED   Amphetamines, Ur Screen NONE DETECTED NONE DETECTED   MDMA (Ecstasy)Ur Screen NONE DETECTED NONE DETECTED   Cocaine Metabolite,Ur [place] NONE DETECTED NONE DETECTED   Opiate, Ur Screen NONE DETECTED NONE DETECTED   Phencyclidine (PCP) Ur S NONE DETECTED NONE DETECTED   Cannabinoid 50 Ng, Ur [place] NONE DETECTED NONE DETECTED   Barbiturates, Ur Screen NONE DETECTED NONE DETECTED   Benzodiazepine, Ur Scrn NONE DETECTED NONE DETECTED   Methadone Scn, Ur NONE DETECTED NONE DETECTED  Chlamydia/NGC rt PCR ([facility] only)     Status: None   Collection Time: 08/14/16  4:23 AM  Result Value Ref Range   Specimen source GC/Chlam URINE, RANDOM    Chlamydia Tr NOT DETECTED NOT DETECTED   N gonorrhoeae NOT DETECTED NOT DETECTED  [facility] and screen [facility]     Status: None   Collection Time: 08/14/16  4:32 AM  Result Value Ref Range   ABO/RH(D) O POS    Antibody Screen NEG    Sample Expiration 08/17/2016   CBC     Status: Abnormal   Collection Time: 08/14/16  4:33 AM  Result Value Ref Range   WBC 21.4 (H) 3.6 - 11.0 K/uL   RBC 4.55 3.80 - 5.20 MIL/uL   Hemoglobin 12.7 12.0 - 16.0 g/dL   HCT  78.238.2 95.635.0 - 21.347.0 %   MCV 84.0 80.0 - 100.0 fL   MCH 27.8 26.0 - 34.0 pg   MCHC 33.1 32.0 - 36.0 g/dL   RDW 08.615.9 (H) 57.811.5 - 46.914.5 %   Platelets 267 150 - 440 K/uL  Glucose, capillary     Status: None   Collection Time: 08/14/16  4:55 AM  Result Value Ref Range   Glucose-Capillary 93 65 - 99 mg/dL    Assessment:   26 y.o. [id number] 7025w4d term labor  Plan:   1) Labor - expectant managment  2) Fetus - cat I tracing

## 2016-08-14 NOTE — Anesthesia Preprocedure Evaluation (Addendum)
Anesthesia Evaluation  Patient identified by MRN, date of birth, ID band Patient awake    Reviewed: Allergy & Precautions, NPO status , Patient's Chart, lab work & pertinent test results  History of Anesthesia Complications Negative for: history of anesthetic complications  Airway Mallampati: III  TM Distance: >3 FB Neck ROM: Full    Dental no notable dental hx.    Pulmonary neg sleep apnea, neg COPD, Current Smoker,    breath sounds clear to auscultation- rhonchi (-) wheezing      Cardiovascular Exercise Tolerance: Good (-) hypertension(-) CAD and (-) Past MI  Rhythm:Regular Rate:Normal - Systolic murmurs and - Diastolic murmurs    Neuro/Psych PSYCHIATRIC DISORDERS Depression negative neurological ROS     GI/Hepatic negative GI ROS, Neg liver ROS,   Endo/Other  negative endocrine ROSneg diabetes  Renal/GU negative Renal ROS     Musculoskeletal negative musculoskeletal ROS (+)   Abdominal (+) + obese, Gravid abdomen  Peds  Hematology negative hematology ROS (+)   Anesthesia Other Findings Past Medical History: No date: Ankle fracture, right 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     Plan Discussed with: Anesthesiologist  Anesthesia Plan Comments: (Plan for epidural for labor, discussed epidural  vs spinal vs GA if need for csection)        Lab Results  Component Value Date   WBC 21.4 (H) 08/14/2016   HGB 12.7 08/14/2016   HCT 38.2 08/14/2016   MCV 84.0 08/14/2016   PLT 267 08/14/2016    Anesthesia Quick Evaluation

## 2016-08-14 NOTE — OB Triage Note (Signed)
Recvd pt from ED. Pt c/o contractions every 3-5 min that started about an hour ago and threw up about 2 hours ago. Rates pain a 10 out of 10. No vaginal bleeding or LOF. Feeling baby move ok.

## 2016-08-14 NOTE — Discharge Summary (Signed)
Physician Obstetric Discharge Summary  Patient ID: Andrea Whitney MRN: [id number] DOB/AGE: 16-Dec-1990 26 y.o.   Date of Admission: 08/14/2016  Date of Discharge: 08/15/16  Admitting Diagnosis: Onset of Labor at 3444w4d  Secondary Diagnosis: Desire for sterilization  Mode of Delivery: normal spontaneous vaginal delivery 08/14/2016      Discharge Diagnosis: Term pregnancy-delivered   Intrapartum Procedures: Atificial rupture of membranes and epidural   Post partum procedures: postpartum tubal ligation  Complications: none   Brief Hospital Course  Andrea Popeshley L Nou is a [id number] who had a SVD on 08/14/2016;  for further details of this delivery, please refer to the delivery note.  Patient had an uncomplicated postpartum course.  By time of discharge on PPD#1, her pain was controlled on oral pain medications; she had appropriate lochia and was ambulating, voiding without difficulty and tolerating regular diet.  She was deemed stable for discharge to home.   Labs: CBC Latest Ref Rng & Units 08/15/2016 08/14/2016 05/26/2016  WBC 3.6 - 11.0 K/uL 17.3(H) 21.4(H) 16.3(H)  Hemoglobin 12.0 - 16.0 g/dL 11.2(L) 12.7 11.5  Hematocrit 35.0 - 47.0 % 33.5(L) 38.2 34.7  Platelets 150 - 440 K/uL 241 267 364   O POS/ RI/ VI/ GBS negative  Physical exam:  Blood pressure 130/82, pulse 78, temperature 97.9 F (36.6 C), temperature source Oral, resp. rate 20, height 5\' 2"  (1.575 m), weight 230 lb (104.3 kg), last menstrual period 10/21/2015, SpO2 98 %, unknown if currently breastfeeding. General: alert and no distress Lochia: appropriate Abdomen: soft, NT Uterine Fundus: firm Incision: healing well, no significant drainage, no dehiscence, no significant erythema Extremities: No evidence of DVT seen on physical exam. No lower extremity edema.  Discharge Instructions: Per After Visit Summary. Activity: Advance as tolerated. Pelvic rest for 6 weeks.  Also refer to Discharge Instructions Diet:  Regular Medications: Allergies as of 08/15/2016      Reactions   Penicillins Rash   Sulfur Rash      Medication List    TAKE these medications   calcium carbonate 500 MG chewable tablet Commonly known as:  TUMS - dosed in mg elemental calcium Chew 1 tablet by mouth daily.   HYDROcodone-acetaminophen 5-325 MG tablet Commonly known as:  NORCO/VICODIN Take 1 tablet by mouth every 4 (four) hours as needed for moderate pain.      Outpatient follow up:  Follow-up Information    Farrel ConnersGutierrez, Colleen, CNM. Schedule an appointment as soon as possible for a visit.   Specialty:  Certified Nurse Midwife Why:  in 1-2 weeks for depression and incision check Contact information: [address] [place] [address] [phone]          Postpartum contraception: bilateral tubal ligation  Discharged Condition: good  Discharged to: home   Newborn Data: Baby Janus Molderndrea Lou 7#3oz Disposition:home with mother  Apgars: APGAR (1 MIN): 9   APGAR (5 MINS): 9   APGAR (10 MINS):    Baby Feeding: Bottle  Letitia Libraobert Paul Harris, MD 08/15/2016 5:45 PM

## 2016-08-14 NOTE — Telephone Encounter (Signed)
-----   Message from Nadara Mustardobert P Harris, MD sent at 08/14/2016  7:33 AM EDT ----- Regarding: appt Cancel appt today, in hospital

## 2016-08-14 NOTE — Anesthesia Procedure Notes (Signed)
Epidural Patient location during procedure: OB Start time: 08/14/2016 5:03 AM End time: 08/14/2016 5:22 AM  Staffing Anesthesiologist: Alver FisherPENWARDEN, Kamilah Correia Performed: anesthesiologist   Preanesthetic Checklist Completed: patient identified, site marked, surgical consent, pre-op evaluation, timeout performed, IV checked, risks and benefits discussed and monitors and equipment checked  Epidural Patient position: sitting Prep: ChloraPrep Patient monitoring: heart rate, continuous pulse ox and blood pressure Approach: midline Location: L3-L4 Injection technique: LOR saline  Needle:  Needle type: Tuohy  Needle gauge: 18 G Needle length: 9 cm and 9 Needle insertion depth: 5 cm Catheter type: closed end flexible Catheter size: 20 Guage Catheter at skin depth: 10 cm Test dose: negative (0.125% bupivacaine)  Assessment Events: blood not aspirated, injection not painful, no injection resistance, negative IV test and no paresthesia  Additional Notes   Patient tolerated the insertion well without complications.Reason for block:procedure for pain

## 2016-08-14 NOTE — H&P (Addendum)
Obstetric H&P   Chief Complaint: Contractions  Prenatal Care Provider: [facility]  History of Present Illness: 26 y.o. [id number] [redacted]w[redacted]d by 08/24/2016, by late 2nd trimester ultrasound presenting in term labor.  +FM, no LOF, no VB.  Contractions with cervix 6cm on presentation.  Prenatal care noteable for late entry to care, +cocaine on UDS, +chlamydia 05/15/16 with negative TOC 7/10.  Pelvis tested to 8lbs 4oz. 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   Growth 35 weeks 07/21/16 5lbs 8oz [id number] c/w 38%ile AFI 9.18cm, repeat AFI 7/10 14.10cm  Clinic [facility] Prenatal Labs  Dating 25 wk bedside u/s confirmed 27 week anatomy scan Blood type: --/--/O POS (08/10 2330)   Genetic Screen 1 Screen: n/a   AFP: n/a     Quad: n/a    NIPS: tbd Antibody:NEG (08/10 2330)  Anatomic US Complete 27 weeks Rubella: 1.89 (08/10 2330) Varicella: Immune  GTT None on record RPR: Non Reactive (08/10 2330)   Rhogam n/a HBsAg: Negative (08/10 2330)   TDaP vaccine 06/23/16               HIV:   negative                                  GBS: negative 07/29/16    Pap: NEEDS POSTPARTUM      Review of Systems: 10 point review of systems negative unless otherwise noted in HPI  Past Medical History: Past Medical History:  Diagnosis Date  . Ankle fracture, right   . Post partum depression     Past Surgical History: Past Surgical History:  Procedure Laterality Date  . ANKLE FRACTURE SURGERY Right    screw placed  . TONSILLECTOMY    . TYMPANOSTOMY TUBE PLACEMENT Bilateral     Family History: Family History  Problem Relation Age of Onset  . Cancer Mother        skin  . Hypertension Father   . Cancer Maternal Grandmother   . Hypertension Paternal Grandmother   . Hypertension Paternal Grandfather   . Heart disease Neg Hx   . Stroke Neg Hx     Social History: Social History   Social History  . Marital status: Single    Spouse name: N/A  . Number of children: N/A  . Years of education: N/A   Occupational  History  . Not on file.   Social History Main Topics  . Smoking status: Current Every Day Smoker    Packs/day: 0.25    Types: Cigarettes  . Smokeless tobacco: Never Used  . Alcohol use No     Comment: occasional  . Drug use: No  . Sexual activity: Yes    Birth control/ protection: None   Other Topics Concern  . Not on file   Social History Narrative  . No narrative on file    Medications: Prior to Admission medications   Medication Sig Start Date End Date Taking? Authorizing Provider  calcium carbonate (TUMS - DOSED IN MG ELEMENTAL CALCIUM) 500 MG chewable tablet Chew 1 tablet by mouth daily.   Yes [provider]    Allergies: Allergies  Allergen Reactions  . Penicillins Rash  . Sulfur Rash    Physical Exam: Vitals: Blood pressure 127/70, pulse (!) 107, temperature 98.3 F (36.8 C), temperature source Oral, resp. rate 18, height 5\' 2"  (1.575 m), weight 244 lb (110.7 kg), last menstrual period 10/21/2015, unknown if currently breastfeeding.  Urine Dip Protein: N/A  FHT: 125, moderate, no accels, no decels Toco: q2-383min  General: NAD HEENT: normocephalic, anicteric Pulmonary: No increased work of breathing Cardiovascular: RRR, distal pulses 2+ Abdomen: Gravid, non-tender Leopolds:vtx Genitourinary: 6cm per nursing staff Extremities: no edema, erythema, or tenderness Neurologic: Grossly intact Psychiatric: mood appropriate, affect full  Labs: No results found for this or any previous visit (from the past 24 hour(s)).  Assessment: 26 y.o. [id number] [id number] by 08/24/2016, by Ultrasound presenting for term labor Plan: 1) Labor - Expectant  2) Fetus - cat I tracing - growth 38%ile, pelvis tested to 8lbs4oz  3) PNL -see above - No 1-hr on record CBG on admission - repeat GC/CT given positive on initial prenatal - UDS on admission  4) TDAP - UTD  5) Disposition - pending delivery

## 2016-08-14 NOTE — Telephone Encounter (Signed)
Pt appointment cancel Per [facility] patient in hosptial

## 2016-08-15 ENCOUNTER — Inpatient Hospital Stay: Payer: Medicaid Other | Admitting: Anesthesiology

## 2016-08-15 ENCOUNTER — Encounter: Payer: Self-pay | Admitting: *Deleted

## 2016-08-15 ENCOUNTER — Encounter
Admission: EM | Disposition: A | Discharge status: Home / Self Care | Payer: Self-pay | Source: Home / Self Care | Attending: Obstetrics and Gynecology

## 2016-08-15 ENCOUNTER — Inpatient Hospital Stay: Payer: Medicaid Other

## 2016-08-15 DIAGNOSIS — Z302 Encounter for sterilization: Secondary | ICD-10-CM

## 2016-08-15 HISTORY — PX: TUBAL LIGATION: SHX77

## 2016-08-15 LAB — CBC
HCT: 33.5 % — ABNORMAL LOW (ref 35.0–47.0)
Hemoglobin: 11.2 g/dL — ABNORMAL LOW (ref 12.0–16.0)
MCH: 28.5 pg (ref 26.0–34.0)
MCHC: 33.6 g/dL (ref 32.0–36.0)
MCV: 84.9 fL (ref 80.0–100.0)
PLATELETS: 241 10*3/uL (ref 150–440)
RBC: 3.94 MIL/uL (ref 3.80–5.20)
RDW: 15.9 % — ABNORMAL HIGH (ref 11.5–14.5)
WBC: 17.3 10*3/uL — ABNORMAL HIGH (ref 3.6–11.0)

## 2016-08-15 LAB — RPR: RPR Ser Ql: NONREACTIVE

## 2016-08-15 SURGERY — LIGATION, FALLOPIAN TUBE, POSTPARTUM
Anesthesia: General

## 2016-08-15 MED ORDER — MIDAZOLAM HCL 2 MG/2ML IJ SOLN
INTRAMUSCULAR | Status: DC | PRN
  Administered 2016-08-15: 2 mg via INTRAVENOUS

## 2016-08-15 MED ORDER — ROCURONIUM BROMIDE 100 MG/10ML IV SOLN
INTRAVENOUS | Status: DC | PRN
  Administered 2016-08-15: 20 mg via INTRAVENOUS
  Administered 2016-08-15: 10 mg via INTRAVENOUS

## 2016-08-15 MED ORDER — ONDANSETRON HCL 4 MG/2ML IJ SOLN: 4.0000 mg | Freq: Once | INTRAMUSCULAR | Status: DC | PRN

## 2016-08-15 MED ORDER — PROPOFOL 10 MG/ML IV BOLUS
INTRAVENOUS | Status: DC | PRN
  Administered 2016-08-15: 200 mg via INTRAVENOUS

## 2016-08-15 MED ORDER — SUGAMMADEX SODIUM 200 MG/2ML IV SOLN
INTRAVENOUS | Status: DC | PRN
  Administered 2016-08-15: 200 mg via INTRAVENOUS

## 2016-08-15 MED ORDER — LACTATED RINGERS IV SOLN
INTRAVENOUS | Status: DC
  Administered 2016-08-15 (×2): via INTRAVENOUS

## 2016-08-15 MED ORDER — LIDOCAINE HCL (CARDIAC) 20 MG/ML IV SOLN
INTRAVENOUS | Status: DC | PRN
  Administered 2016-08-15: 100 mg via INTRAVENOUS

## 2016-08-15 MED ORDER — SUCCINYLCHOLINE CHLORIDE 20 MG/ML IJ SOLN
INTRAMUSCULAR | Status: DC | PRN
  Administered 2016-08-15: 100 mg via INTRAVENOUS

## 2016-08-15 MED ORDER — FENTANYL CITRATE (PF) 100 MCG/2ML IJ SOLN
INTRAMUSCULAR | Status: AC
  Administered 2016-08-15: 25 ug via INTRAVENOUS
  Filled 2016-08-15: qty 2

## 2016-08-15 MED ORDER — HYDROCODONE-ACETAMINOPHEN 5-325 MG PO TABS: 1.0000 | ORAL_TABLET | ORAL | Status: DC | PRN

## 2016-08-15 MED ORDER — FENTANYL CITRATE (PF) 100 MCG/2ML IJ SOLN
INTRAMUSCULAR | Status: DC | PRN
  Administered 2016-08-15: 100 ug via INTRAVENOUS

## 2016-08-15 MED ORDER — ONDANSETRON HCL 4 MG/2ML IJ SOLN
INTRAMUSCULAR | Status: DC | PRN
  Administered 2016-08-15: 4 mg via INTRAVENOUS

## 2016-08-15 MED ORDER — FENTANYL CITRATE (PF) 100 MCG/2ML IJ SOLN
25.0000 ug | INTRAMUSCULAR | Status: AC | PRN
  Administered 2016-08-15 (×6): 25 ug via INTRAVENOUS

## 2016-08-15 MED ORDER — DEXAMETHASONE SODIUM PHOSPHATE 10 MG/ML IJ SOLN
INTRAMUSCULAR | Status: DC | PRN
  Administered 2016-08-15: 10 mg via INTRAVENOUS

## 2016-08-15 MED ORDER — BUPIVACAINE HCL 0.5 % IJ SOLN
INTRAMUSCULAR | Status: DC | PRN
  Administered 2016-08-15: 5 mL

## 2016-08-15 MED ORDER — HYDROCODONE-ACETAMINOPHEN 5-325 MG PO TABS: 1.0000 | ORAL_TABLET | ORAL | 0 refills | Status: AC | PRN

## 2016-08-15 SURGICAL SUPPLY — 23 items
CHLORAPREP W/TINT 26ML (MISCELLANEOUS) ×3 IMPLANT
DERMABOND ADVANCED (GAUZE/BANDAGES/DRESSINGS) ×2
DERMABOND ADVANCED .7 DNX12 (GAUZE/BANDAGES/DRESSINGS) ×1 IMPLANT
DRAPE LAPAROTOMY T 102X78X121 (DRAPES) ×3 IMPLANT
DRSG TEGADERM 2-3/8X2-3/4 SM (GAUZE/BANDAGES/DRESSINGS) IMPLANT
DRSG TELFA 4X3 1S NADH ST (GAUZE/BANDAGES/DRESSINGS) IMPLANT
ELECT CAUTERY BLADE 6.4 (BLADE) ×3 IMPLANT
ELECT REM PT RETURN 9FT ADLT (ELECTROSURGICAL) ×3
ELECTRODE REM PT RTRN 9FT ADLT (ELECTROSURGICAL) ×1 IMPLANT
GLOVE BIO SURGEON STRL SZ8 (GLOVE) ×3 IMPLANT
GOWN STRL REUS W/ TWL LRG LVL3 (GOWN DISPOSABLE) ×1 IMPLANT
GOWN STRL REUS W/ TWL XL LVL3 (GOWN DISPOSABLE) ×1 IMPLANT
GOWN STRL REUS W/TWL LRG LVL3 (GOWN DISPOSABLE) ×2
GOWN STRL REUS W/TWL XL LVL3 (GOWN DISPOSABLE) ×2
LABEL OR SOLS (LABEL) IMPLANT
NDL SAFETY 22GX1.5 (NEEDLE) ×3 IMPLANT
NS IRRIG 500ML POUR BTL (IV SOLUTION) ×3 IMPLANT
PACK BASIN MINOR ARMC (MISCELLANEOUS) ×3 IMPLANT
STRAP SAFETY BODY (MISCELLANEOUS) ×3 IMPLANT
SUT VIC AB 0 SH 27 (SUTURE) ×6 IMPLANT
SUT VIC AB 2-0 UR6 27 (SUTURE) ×3 IMPLANT
SUT VIC AB 4-0 PS2 18 (SUTURE) IMPLANT
SYRINGE 10CC LL (SYRINGE) ×3 IMPLANT

## 2016-08-15 NOTE — Anesthesia Post-op Follow-up Note (Cosign Needed)
Anesthesia QCDR form completed.        

## 2016-08-15 NOTE — Transfer of Care (Signed)
Immediate Anesthesia Transfer of Care Note  Patient: Andrea Whitney  Procedure(s) Performed: Procedure(s): POST PARTUM TUBAL LIGATION (N/A)  Patient Location: PACU  Anesthesia Type:General  Level of Consciousness: awake  Airway & Oxygen Therapy: Patient Spontanous Breathing  Post-op Assessment: Report given to RN  Post vital signs: stable  Last Vitals:  Vitals:   08/15/16 1125 08/15/16 1147  BP: 114/61 (!) 104/57  Pulse: 68 (!) 59  Resp: 18 16  Temp: (!) 36.4 C (!) 36.3 C    Last Pain:  Vitals:   08/15/16 1147  TempSrc: Tympanic  PainSc: 0-No pain      Patients Stated Pain Goal: 0 (08/15/16 1147)  Complications: No apparent anesthesia complications

## 2016-08-15 NOTE — Progress Notes (Signed)
Discharge order received from doctor. Reviewed discharge instructions and prescriptions with patient and answered all questions. Follow up appointment instructions given. Patient verbalized understanding. ID bands checked. Patient discharged home in stable condition with infant via wheelchair by nursing/auxillary.    Oswald HillockAbigail Garner, RN

## 2016-08-15 NOTE — Op Note (Signed)
Operative Note  08/15/2016  PRE-OP DIAGNOSIS: Desire for sterility  POST-OP DIAGNOSIS: same  SURGEON: Annamarie MajorPaul Trip Cavanagh, MD, FACOG  PROCEDURE: Postpartum Bilateral Tubal Ligation Procedure   ANESTHESIA: Choice   ESTIMATED BLOOD LOSS: Min  SPECIMENS: Portion of left and right tube  COMPLICATIONS: None  DISPOSITION: PACU - hemodynamically stable.  CONDITION: stable  FINDINGS: Exam under anesthesia revealed normal uterus with no masses and bilateral adnexa without masses or fullness.   TECHNIQUE:  Patient is prepped and draped in usual sterile fashion after adequate anesthesia is obtained in the supine position on the operating room table.  Local anesthesia is injected into the skin just inferior to the umbilicus, followed by a small elliptical incision with a scapel.  Fascia is identified and tented upwards, and an incision is made with Mayo scissors.  Identification of no adherent bowel is made. Retractors are placed and trendelenburg positioning is achieved.    The left Fallopian tube was identified, grasped with the Babcock clamps, lifted to the skin incision and followed out distally to the fimbriae. An avascular midsection of the tube approximately 3-4cm from the cornua was grasped with the babcock clamps and brought into a knuckle at the skin incision. The tube was double ligated with 2-0 Vicryl suture and the intervening portion of tube was transected and removed. Excellent hemostasis was noted and the tube was returned to the abdomen. Attention was then turned to the right fallopian tube after confirmation of identification by tracing the tube out to the fimbriae. The same procedure was then performed on the right Fallopian tube. Again, excellent hemostasis was noted at the end of the procedure.  Retractors are removed and fascia closed with a 2-0 Vicryl suture. Irrigation and hemostasis confirmed.  Skin closed with a 4-0 vicryl suture in a subcuticular fashion followed by skin  adhesive.  Pt goes to recovery room in stable fashion.     Annamarie MajorPaul Mackenze Grandison, MD, Merlinda FrederickFACOG [facility], [facility] 08/15/2016  1:23 PM

## 2016-08-15 NOTE — Discharge Instructions (Signed)
Please call your doctor or return to the ER if you experience any chest pains, shortness of breath, dizziness, visual changes, fever greater than 101, any heavy bleeding (saturating more than 1 pad per hour), large clots, or foul smelling discharge, any worsening abdominal pain and cramping that is not controlled by pain medication, or any signs of postpartum depression. No tampons, enemas, douches, or sexual intercourse for 6 weeks. Also avoid tub baths, hot tubs, or swimming for 6 weeks.   Check your incision daily for any signs of infection such as redness, warmth, swelling, increasing pain, pus or foul smelling drainage    Vaginal Delivery, Care After Refer to this sheet in the next few weeks. These discharge instructions provide you with information on caring for yourself after delivery. Your caregiver may also give you specific instructions. Your treatment has been planned according to the most current medical practices available, but problems sometimes occur. Call your caregiver if you have any problems or questions after you go home. HOME CARE INSTRUCTIONS 1. Take over-the-counter or prescription medicines only as directed by your caregiver or pharmacist. 2. Do not drink alcohol, especially if you are breastfeeding or taking medicine to relieve pain. 3. Do not smoke tobacco. 4. Continue to use good perineal care. Good perineal care includes: 1. Wiping your perineum from back to front 2. Keeping your perineum clean. 3. You can do sitz baths twice a day, to help keep this area clean 5. Do not use tampons, douche or have sex for 6 weeks 6. Shower only and avoid sitting in submerged water, aside from sitz baths 7. Wear a well-fitting bra that provides breast support. 8. Eat healthy foods. 9. Drink enough fluids to keep your urine clear or pale yellow. 10. Eat high-fiber foods such as whole grain cereals and breads, brown rice, beans, and fresh fruits and vegetables every day. These foods may  help prevent or relieve constipation. 11. Avoid constipation with high fiber foods or medications, such as miralax or metamucil 12. Follow your caregiver's recommendations regarding resumption of activities such as climbing stairs, driving, lifting, exercising, or traveling. 13. Talk to your caregiver about resuming sexual activities. Resumption of sexual activities after 6 weeks is dependent upon your risk of infection, your rate of healing, and your comfort and desire to resume sexual activity. 14. Try to have someone help you with your household activities and your newborn for at least a few days after you leave the hospital. 15. Rest as much as possible. Try to rest or take a nap when your newborn is sleeping. 16. Increase your activities gradually. 17. Keep all of your scheduled postpartum appointments. It is very important to keep your scheduled follow-up appointments. At these appointments, your caregiver will be checking to make sure that you are healing physically and emotionally. SEEK MEDICAL CARE IF:   You are passing large clots from your vagina. Save any clots to show your caregiver.  You have a foul smelling discharge from your vagina.  You have trouble urinating.  You are urinating frequently.  You have pain when you urinate.  You have a change in your bowel movements.  You have increasing redness, pain, or swelling near your vaginal incision (episiotomy) or vaginal tear.  You have pus draining from your episiotomy or vaginal tear.  Your episiotomy or vaginal tear is separating.  You have painful, hard, or reddened breasts.  You have a severe headache.  You have blurred vision or see spots.  You feel sad or  depressed.  You have thoughts of hurting yourself or your newborn.  You have questions about your care, the care of your newborn, or medicines.  You are dizzy or light-headed.  You have a rash.  You have nausea or vomiting.  You were breastfeeding and  have not had a menstrual period within 12 weeks after you stopped breastfeeding.  You are not breastfeeding and have not had a menstrual period by the 12th week after delivery.  You have a fever of 100.5 or more SEEK IMMEDIATE MEDICAL CARE IF:   You have persistent pain.  You have chest pain.  You have shortness of breath.  You faint.  You have leg pain.  You have stomach pain.  Your vaginal bleeding saturates two or more sanitary pads in 1 hour. MAKE SURE YOU:   Understand these instructions.  Will watch your condition.  Will get help right away if you are not doing well or get worse. Document Released: 01/04/2000 Document Revised: 05/23/2013 Document Reviewed: 09/03/2011 [facility] Patient Information 2015 [place], [place]. This information is not intended to replace advice given to you by your health care provider. Make sure you discuss any questions you have with your health care provider.  Sitz Bath A sitz bath is a warm water bath taken in the sitting position. The water covers only the hips and butt (buttocks). We recommend using one that fits in the toilet, to help with ease of use and cleanliness. It may be used for either healing or cleaning purposes. Sitz baths are also used to relieve pain, itching, or muscle tightening (spasms). The water may contain medicine. Moist heat will help you heal and relax.  HOME CARE  Take 3 to 4 sitz baths a day. 18. Fill the bathtub half-full with warm water. 19. Sit in the water and open the drain a little. 20. Turn on the warm water to keep the tub half-full. Keep the water running constantly. 21. Soak in the water for 15 to 20 minutes. 22. After the sitz bath, pat the affected area dry. GET HELP RIGHT AWAY IF: You get worse instead of better. Stop the sitz baths if you get worse. MAKE SURE YOU:  Understand these instructions.  Will watch your condition.  Will get help right away if you are not doing well or get worse. Document  Released: 02/14/2004 Document Revised: 10/01/2011 Document Reviewed: 05/06/2010 [facility] NorthExitCare Patient Information 2015 [place], [place]. This information is not intended to replace advice given to you by your health care provider. Make sure you discuss any questions you have with your health care provider.

## 2016-08-15 NOTE — Anesthesia Postprocedure Evaluation (Signed)
Anesthesia Post Note  Patient: Andrea Whitney  Procedure(s) Performed: * No procedures listed *  Patient location during evaluation: [facility] Anesthesia Type: Epidural Level of consciousness: awake, awake and alert and oriented Pain management: pain level controlled Vital Signs Assessment: post-procedure vital signs reviewed and stable Respiratory status: spontaneous breathing, nonlabored ventilation and respiratory function stable Cardiovascular status: stable Postop Assessment: no headache, no backache, adequate PO intake, no signs of nausea or vomiting and patient able to bend at knees Anesthetic complications: no     Last Vitals:  Vitals:   08/15/16 0545 08/15/16 0722  BP: 132/70 (!) 104/58  Pulse: 84 62  Resp: 18 18  Temp: 36.7 C 36.6 C    Last Pain:  Vitals:   08/15/16 0722  TempSrc: Oral  PainSc:                  Lyn RecordsNoles,  Adaley Kiene R

## 2016-08-15 NOTE — Anesthesia Preprocedure Evaluation (Signed)
Anesthesia Evaluation  Patient identified by MRN, date of birth, ID band Patient awake    Reviewed: Allergy & Precautions, NPO status , Patient's Chart, lab work & pertinent test results  History of Anesthesia Complications Negative for: history of anesthetic complications  Airway Mallampati: III  TM Distance: >3 FB Neck ROM: Full    Dental no notable dental hx.    Pulmonary neg sleep apnea, neg COPD, Current Smoker,    breath sounds clear to auscultation- rhonchi (-) wheezing      Cardiovascular Exercise Tolerance: Good (-) hypertension(-) CAD and (-) Past MI negative cardio ROS   Rhythm:Regular Rate:Normal - Systolic murmurs and - Diastolic murmurs    Neuro/Psych PSYCHIATRIC DISORDERS Depression negative neurological ROS     GI/Hepatic negative GI ROS, Neg liver ROS,   Endo/Other  negative endocrine ROSneg diabetes  Renal/GU negative Renal ROS  negative genitourinary   Musculoskeletal negative musculoskeletal ROS (+)   Abdominal (+) + obese, Gravid abdomen  Peds negative pediatric ROS (+)  Hematology negative hematology ROS (+)   Anesthesia Other Findings Past Medical History: No date: Ankle fracture, right No date: Post partum depression   Reproductive/Obstetrics                             Anesthesia Physical  Anesthesia Plan  ASA: II  Anesthesia Plan: General   Post-op Pain Management:    Induction:   PONV Risk Score and Plan: 3 and Ondansetron, Dexamethasone, Propofol and Midazolam  Airway Management Planned: Oral ETT  Additional Equipment:   Intra-op Plan:   Post-operative Plan: Extubation in OR  Informed Consent: I have reviewed the patients History and Physical, chart, labs and discussed the procedure including the risks, benefits and alternatives for the proposed anesthesia with the patient or authorized representative who has indicated his/her understanding  and acceptance.   Dental advisory given  Plan Discussed with: CRNA and Surgeon  Anesthesia Plan Comments: (Plan for epidural for labor, discussed epidural vs spinal vs GA if need for csection)        Lab Results  Component Value Date   WBC 17.3 (H) 08/15/2016   HGB 11.2 (L) 08/15/2016   HCT 33.5 (L) 08/15/2016   MCV 84.9 08/15/2016   PLT 241 08/15/2016    Anesthesia Quick Evaluation

## 2016-08-15 NOTE — Anesthesia Procedure Notes (Signed)
Procedure Name: Intubation Date/Time: 08/15/2016 12:53 PM Performed by: Leander Rams Pre-anesthesia Checklist: Patient identified, Emergency Drugs available, Suction available, Patient being monitored and Timeout performed Patient Re-evaluated:Patient Re-evaluated prior to induction Oxygen Delivery Method: Circle system utilized Preoxygenation: Pre-oxygenation with 100% oxygen Induction Type: IV induction Ventilation: Mask ventilation without difficulty Laryngoscope Size: Mac and 3 Grade View: Grade I Tube type: Oral Tube size: 6.5 mm Number of attempts: 1 Airway Equipment and Method: Stylet

## 2016-08-15 NOTE — Progress Notes (Signed)
Patient back to unit at this time.   Oswald HillockAbigail Garner, RN

## 2016-08-15 NOTE — Progress Notes (Signed)
Pre-procedure checklist completed. Patient transferred to OR for bilateral tubal ligation at this time.    Oswald HillockAbigail Garner, RN

## 2016-08-15 NOTE — Progress Notes (Signed)
Admit Date: 08/14/2016 Today's Date: 08/15/2016  Post Partum Day 1  Subjective:  no complaints  Objective: Temp:  [97.6 F (36.4 C)-98.5 F (36.9 C)] 97.9 F (36.6 C) (07/27 0722) Pulse Rate:  [60-96] 62 (07/27 0722) Resp:  [18] 18 (07/27 0722) BP: (104-140)/(51-75) 104/58 (07/27 0722) SpO2:  [98 %] 98 % (07/27 0722)  Physical Exam:  General: alert, cooperative, appears stated age and no distress Lochia: appropriate Uterine Fundus: firm Incision: none DVT Evaluation: No evidence of DVT seen on physical exam.   Recent Labs  08/14/16 0433 08/15/16 0409  HGB 12.7 11.2*  HCT 38.2 33.5*    Assessment/Plan: Bottle Feeding and Infant doing well  Plans BTL today.  Counseled as to all options (interval vs now) and risks and benefits of surgery. The patient has been fully informed about all methods of contraception, both temporary and permanent. She understands that tubal ligation is meant to be permanent, absolute and irreversible. She was told that there is an approximately 1 in 400 chance of a pregnancy in the future after tubal ligation. She was told the short and long term complications of tubal ligation. She understands the risks from this surgery include, but are not limited to, the risks of anesthesia, hemorrhage, infection, perforation, and injury to adjacent structures, bowel, bladder and blood vessels.     LOS: 1 day   Letitia Libraobert Paul [facility] 08/15/2016, 9:15 AM

## 2016-08-18 LAB — SURGICAL PATHOLOGY

## 2016-08-18 NOTE — Anesthesia Postprocedure Evaluation (Signed)
Anesthesia Post Note  Patient: Andrea Whitney  Procedure(s) Performed: Procedure(s) (LRB): POST PARTUM TUBAL LIGATION (N/A)  Patient location during evaluation: PACU Anesthesia Type: General Level of consciousness: awake and alert and oriented Pain management: pain level controlled Vital Signs Assessment: post-procedure vital signs reviewed and stable Respiratory status: spontaneous breathing Cardiovascular status: blood pressure returned to baseline Anesthetic complications: no     Last Vitals:  Vitals:   08/15/16 1445 08/15/16 1601  BP: 133/73 130/82  Pulse: 74 78  Resp: 18 20  Temp: 36.5 C 36.6 C    Last Pain:  Vitals:   08/15/16 1601  TempSrc: Oral  PainSc:                  [name]

## 2017-02-25 ENCOUNTER — Telehealth: Payer: Self-pay | Admitting: Family Medicine

## 2017-02-25 MED ORDER — OSELTAMIVIR PHOSPHATE 75 MG PO CAPS: 75.0000 mg | ORAL_CAPSULE | Freq: Every day | ORAL | 0 refills | Status: AC

## 2017-02-25 NOTE — Telephone Encounter (Signed)
Preventative dose tamiflu sent

## 2017-02-25 NOTE — Telephone Encounter (Signed)
Copied from CRM [phone]. Topic: Quick Communication - See Telephone Encounter >> Feb 25, 2017  1:37 PM Arlyss Gandyichardson, Eman Rynders N, NT wrote: CRM for notification. See Telephone encounter for: Pt lives with mom as well as her brother and several children. Her brother, nephew, and dad all have the flu. Pt needing the preventative tamiflu [place] [facility] [place]   02/25/17.

## 2017-02-25 NOTE — Telephone Encounter (Signed)
Voicemail left of medication being ready at pharmacy, DPR reviewed.
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
## 2019-05-31 ENCOUNTER — Other Ambulatory Visit: Payer: Self-pay

## 2019-05-31 ENCOUNTER — Encounter: Payer: Self-pay | Admitting: Emergency Medicine

## 2019-05-31 DIAGNOSIS — Y939 Activity, unspecified: Secondary | ICD-10-CM | POA: Diagnosis not present

## 2019-05-31 DIAGNOSIS — Y999 Unspecified external cause status: Secondary | ICD-10-CM | POA: Insufficient documentation

## 2019-05-31 DIAGNOSIS — Y929 Unspecified place or not applicable: Secondary | ICD-10-CM | POA: Diagnosis not present

## 2019-05-31 DIAGNOSIS — T148XXA Other injury of unspecified body region, initial encounter: Secondary | ICD-10-CM | POA: Insufficient documentation

## 2019-05-31 DIAGNOSIS — T7491XA Unspecified adult maltreatment, confirmed, initial encounter: Secondary | ICD-10-CM | POA: Diagnosis present

## 2019-05-31 DIAGNOSIS — Z79899 Other long term (current) drug therapy: Secondary | ICD-10-CM | POA: Diagnosis not present
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
## 2019-06-01 ENCOUNTER — Emergency Department
Admission: EM | Admit: 2019-06-01 | Discharge: 2019-06-01 | Disposition: A | Discharge status: Home / Self Care | Payer: Medicaid Other | Attending: Emergency Medicine | Admitting: Emergency Medicine

## 2019-06-01 DIAGNOSIS — T7491XA Unspecified adult maltreatment, confirmed, initial encounter: Secondary | ICD-10-CM

## 2019-06-01 DIAGNOSIS — T07XXXA Unspecified multiple injuries, initial encounter: Secondary | ICD-10-CM
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
## 2019-06-07 ENCOUNTER — Other Ambulatory Visit: Payer: Self-pay

## 2019-06-08 NOTE — Telephone Encounter (Signed)
Tried refusing medication as patient is not a current patient. Has not been seen since 04/18/2016. Routing to provider to refuse.

## 2019-09-05 ENCOUNTER — Ambulatory Visit: Payer: Medicaid Other | Attending: Internal Medicine

## 2019-09-05 DIAGNOSIS — Z23 Encounter for immunization: Secondary | ICD-10-CM

## 2019-09-05 NOTE — Progress Notes (Signed)
   Covid-19 Vaccination Clinic  Name:  TRISTIN GLADMAN    MRN: [id number] DOB: May 25, 1990  09/05/2019  Ms. Fleisher was observed post Covid-19 immunization for 15 minutes without incident. She was provided with Vaccine Information Sheet and instruction to access the V-Safe system.   Ms. Mccaughey was instructed to call 911 with any severe reactions post vaccine: Marland Kitchen Difficulty breathing  . Swelling of face and throat  . A fast heartbeat  . A bad rash all over body  . Dizziness and weakness   Immunizations Administered    Name Date Dose VIS Date Route   Moderna COVID-19 Vaccine 09/05/2019  3:23 PM 0.5 mL 12/2018 Intramuscular   Manufacturer: Moderna   Lot: [id number]   NDC: 33825-053-97

## 2019-09-21 DIAGNOSIS — Z419 Encounter for procedure for purposes other than remedying health state, unspecified: Secondary | ICD-10-CM | POA: Diagnosis not present

## 2019-10-03 ENCOUNTER — Ambulatory Visit: Payer: Medicaid Other

## 2019-10-12 DIAGNOSIS — F4310 Post-traumatic stress disorder, unspecified: Secondary | ICD-10-CM | POA: Diagnosis not present

## 2019-10-12 DIAGNOSIS — F329 Major depressive disorder, single episode, unspecified: Secondary | ICD-10-CM | POA: Diagnosis not present

## 2019-11-23 DIAGNOSIS — J069 Acute upper respiratory infection, unspecified: Secondary | ICD-10-CM | POA: Diagnosis not present

## 2019-12-01 DIAGNOSIS — J029 Acute pharyngitis, unspecified: Secondary | ICD-10-CM | POA: Diagnosis not present

## 2019-12-01 DIAGNOSIS — B084 Enteroviral vesicular stomatitis with exanthem: Secondary | ICD-10-CM | POA: Diagnosis not present

## 2020-03-14 ENCOUNTER — Ambulatory Visit: Payer: Self-pay | Admitting: Licensed Clinical Social Worker

## 2020-03-14 NOTE — Chronic Care Management (AMB) (Signed)
  Care Management   Follow Up Note   03/14/2020 Name: Andrea Whitney MRN: [id number] DOB: [birth year]  Referred by: Larae Grooms, NP Reason for referral : Care Coordination   Andrea Whitney is a 30 y.o. year old female who is a primary care patient of Larae Grooms, NP. The care management team was consulted for assistance with care management and care coordination needs.    Review of patient status, including review of consultants reports, relevant laboratory and other test results, and collaboration with appropriate care team members and the patient's provider was performed as part of comprehensive patient evaluation and provision of chronic care management services.    LCSW completed CCM outreach attempt today but was unable to reach patient or her grandmother successfully. A HIPPA compliant voice message was left encouraging patient or family to return call once available. LCSW will ask Scheduling Care Guide to reschedule CCM SW appointment with patient as well.  A HIPPA compliant phone message was left for the patient providing contact information and requesting a return call.   Dickie La, BSW, MSW, LCSW [facility] Care Management [place]  [facility] [email or web address] Phone: [phone]

## 2020-03-20 ENCOUNTER — Telehealth: Payer: Self-pay

## 2020-03-20 NOTE — Chronic Care Management (AMB) (Signed)
  Care Management   Outreach Note  03/20/2020 Name: Andrea Whitney MRN: [id number] DOB: [birth year]  Referred by: Larae Grooms, NP Reason for referral : Care Coordination (Outreach to schedule with Licensed Clinical SW per LCSW )   An unsuccessful telephone outreach was attempted today. The patient was referred to the case management team for assistance with care management and care coordination.   Follow Up Plan: The care management team will reach out to the patient again over the next 5 days.  If patient returns call to provider office, please advise to call Embedded Care Management Care Guide Penne Lash at [phone]  Penne Lash, RMA Care Guide, Embedded Care Coordination [facility]  [place], [place] [address] Direct Dial: [phone] [email or web address] Website: [email or web address]

## 2020-04-05 NOTE — Chronic Care Management (AMB) (Signed)
  Care Management   Outreach Note  04/05/2020 Name: Andrea Whitney MRN: [id number] DOB: Jan 17, 1991  Referred by: Larae Grooms, NP Reason for referral : Care Coordination (Outreach to schedule with Licensed Clinical SW per LCSW /Second Outreach to schedule with Licensed Clinical SW per LCSW )   A second unsuccessful telephone outreach was attempted today. The patient was referred to the case management team for assistance with care management and care coordination.   Follow Up Plan: A HIPAA compliant phone message was left for the patient providing contact information and requesting a return call.  The care management team will reach out to the patient again over the next 7 days.  If patient returns call to provider office, please advise to call Embedded Care Management Care Guide Penne Lash at [phone]  Penne Lash, RMA Care Guide, Embedded Care Coordination [facility]  [place], [place] [address] Direct Dial: [phone] [email or web address] Website: [email or web address]

## 2020-04-11 DIAGNOSIS — S92532A Displaced fracture of distal phalanx of left lesser toe(s), initial encounter for closed fracture: Secondary | ICD-10-CM | POA: Diagnosis not present

## 2020-04-11 DIAGNOSIS — S8012XA Contusion of left lower leg, initial encounter: Secondary | ICD-10-CM | POA: Diagnosis not present

## 2020-04-11 DIAGNOSIS — M7989 Other specified soft tissue disorders: Secondary | ICD-10-CM | POA: Diagnosis not present

## 2020-04-12 ENCOUNTER — Telehealth: Payer: Self-pay

## 2020-04-12 NOTE — Telephone Encounter (Signed)
Transition Care Management Unsuccessful Follow-up Telephone Call  Date of discharge and from where:  04/11/2020 from [facility]  Attempts:  1st Attempt  Reason for unsuccessful TCM follow-up call:  Unable to leave message

## 2020-04-13 NOTE — Chronic Care Management (AMB) (Signed)
  Care Management   Outreach Note  04/13/2020 Name: Andrea Whitney MRN: [id number] DOB: 1990-05-25  Referred by: Larae Grooms, NP Reason for referral : Care Coordination (Outreach to schedule with Licensed Clinical SW per LCSW /Second Outreach to schedule with Licensed Clinical SW per [place] /Third Outreach to schedule with Licensed Clinical SW per [employer] /)   Third unsuccessful telephone outreach was attempted today. The patient was referred to the case management team for assistance with care management and care coordination. The patient's primary care provider has been notified of our unsuccessful attempts to make or maintain contact with the patient. The care management team is pleased to engage with this patient at any time in the future should he/she be interested in assistance from the care management team.   Follow Up Plan: We have been unable to make contact with the patient for follow up. The care management team is available to follow up with the patient after provider conversation with the patient regarding recommendation for care management engagement and subsequent re-referral to the care management team.   Penne Lash, RMA Care Guide, Embedded Care Coordination [facility]  [place], [place] [address] Direct Dial: [phone] [email or web address] Website: [email or web address]

## 2020-04-13 NOTE — Progress Notes (Signed)
3rd unsuccessful outreach  

## 2020-04-13 NOTE — Telephone Encounter (Signed)
Transition Care Management Unsuccessful Follow-up Telephone Call  Date of discharge and from where:  04/11/2020 from [facility]  Attempts:  2nd Attempt  Reason for unsuccessful TCM follow-up call:  Unable to leave message

## 2020-04-16 NOTE — Telephone Encounter (Signed)
Transition Care Management Follow-up Telephone Call  Date of discharge and from where: 04/11/2020 [facility]  How have you been since you were released from the hospital? "Much better"  Any questions or concerns? No  Items Reviewed:  Did the pt receive and understand the discharge instructions provided? Yes   Medications obtained and verified? Yes   Other? No   Any new allergies since your discharge? No   Dietary orders reviewed? No  Do you have support at home? Yes    Functional Questionnaire: (I = Independent and D = Dependent) ADLs: I  Bathing/Dressing- I  Meal Prep- I  Eating- I  Maintaining continence- I  Transferring/Ambulation- I  Managing Meds- I  Follow up appointments reviewed:   PCP Hospital f/u appt confirmed? No    Specialist Hospital f/u appt confirmed? No   Are transportation arrangements needed? No   If their condition worsens, is the pt aware to call PCP or go to the Emergency Dept.? Yes  Was the patient provided with contact information for the PCP's office or ED? Yes  Was to pt encouraged to call back with questions or concerns? Yes

## 2022-03-28 ENCOUNTER — Ambulatory Visit: Payer: Medicaid Other | Admitting: Family

## 2022-03-28 DIAGNOSIS — B9689 Other specified bacterial agents as the cause of diseases classified elsewhere: Secondary | ICD-10-CM

## 2022-03-28 DIAGNOSIS — Z113 Encounter for screening for infections with a predominantly sexual mode of transmission: Secondary | ICD-10-CM

## 2022-03-28 LAB — WET PREP FOR TRICH, YEAST, CLUE
Trichomonas Exam: NEGATIVE
Yeast Exam: NEGATIVE

## 2022-03-28 LAB — HEPATITIS B SURFACE ANTIGEN

## 2022-03-28 MED ORDER — METRONIDAZOLE 500 MG PO TABS: 500.0000 mg | ORAL_TABLET | Freq: Two times a day (BID) | ORAL | 0 refills | Status: AC

## 2022-03-28 NOTE — Progress Notes (Signed)
Pt appointment for STI screening. Notified by plasma center she may have Hep C. Seen by [place]. 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 RN provided counseling and emotional support. Initial lab results reviewed with pt. Metronidazole dispensed with instructions to treat BV per order. RN also provided local resources and Narcan dispensed with instructions.

## 2022-04-02 ENCOUNTER — Encounter: Payer: Self-pay | Admitting: Family

## 2022-04-02 NOTE — Progress Notes (Signed)
[facility] Department  STI clinic/screening visit [place] [address] [phone]  Subjective:  KADIAN MURREY is a 32 y.o. female being seen today for an STI screening visit. The patient reports they do not have symptoms.  Patient reports that they do not desire a pregnancy in the next year.   They reported they are not interested in discussing contraception today.    Patient's last menstrual period was 03/05/2022 (approximate).  Patient has the following medical conditions:   Patient Active Problem List   Diagnosis Date Noted   Request for sterilization 08/15/2016   Normal labor 08/14/2016   Postpartum care following vaginal delivery 08/14/2016   Cocaine use complicating pregnancy, second trimester 05/19/2016   Supervision of high risk pregnancy, antepartum, first trimester [id number]   Obesity complicating pregnancy, first trimester 05/15/2016   BMI 40.0-44.9, adult ([place]) 05/15/2016   History of postpartum depression 05/15/2016   Late prenatal care affecting pregnancy in second trimester 05/15/2016    Chief Complaint  Patient presents with   SEXUALLY TRANSMITTED DISEASE    Plasma donation center said 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     HPI  Patient reports was informed by the [facility] that her screening Hepatitis C was positive. Advised to F/U with further testing and management, unable to donate plasma. Admits to being an IV drug user, in the past and presently. Her current partner told her that he used to have Hep C, unsure of treatment.   Does the patient using douching products? No  Last HIV test per patient/review of record was No results found for: "HMHIVSCREEN"  Lab Results  Component Value Date   HIV Non Reactive 05/26/2016   Patient reports last pap was No results found for: "DIAGPAP" No results found for: "SPECADGYN"  Screening for MPX risk: Does the patient have an unexplained rash? No Is  the patient MSM? No Does the patient endorse multiple sex partners or anonymous sex partners? No Did the patient have close or sexual contact with a person diagnosed with MPX? No Has the patient traveled outside the [place] where MPX is endemic? No Is there a high clinical suspicion for MPX-- evidenced by one of the following No  -Unlikely to be chickenpox  -Lymphadenopathy  -Rash that present in same phase of evolution on any given body part See flowsheet for further details and programmatic requirements.   Immunization history:  Immunization History  Administered Date(s) Administered   DTaP 08/18/1990, 09/29/1991, 08/16/1992, 09/10/1995   HIB (PRP-OMP) 08/18/1990, 09/29/1991, 08/16/1992   Hepatitis B 11/16/2001, 12/14/2001, 05/17/2002   IPV 08/18/1990, 09/29/1991, 09/10/1995   MMR 09/29/1991, 09/10/1995   Moderna Sars-Covid-2 Vaccination 09/05/2019   Tdap 06/23/2016     The following portions of the patient's history were reviewed and updated as appropriate: allergies, current medications, past medical history, past social history, past surgical history and problem list.  Objective:  There were no vitals filed for this visit.  Physical Exam Patient declines Physical Exam Chooses to self-swab for wet prep and GC/CT [place]  Assessment and Plan:  NAZYIA DEJARDIN is a 32 y.o. female presenting to the [facility] Department for STI screening  1. Screening for venereal disease Will contact with any positive results Will discuss Hepatitis C result and refer as needed Use condoms for all sex  - Chlamydia/Gonorrhea [facility] - HIV/HCV [place] [facility] - WET PREP FOR [place], YEAST, CLUE - Syphilis Serology, [place]  Lab - HBV Antigen/Antibody [facility]  2. Bacterial vaginosis Metronidazole '500mg'$  PO BID x 7 days, #14 ETOH precautions Abstain from sex for 7 days after treatment Use condoms for all sex thereafter  Patient accepted all screenings including vaginal CT/GC and  bloodwork for HIV/RPR, and wet prep. Patient meets criteria for HepB screening? Yes. Ordered? yes Patient meets criteria for HepC screening? Yes. Ordered? yes  Treat wet prep per standing order Discussed time line for State Lab results and that patient will be called with positive results and encouraged patient to call if she had not heard in 2 weeks.  Counseled to return or seek care for continued or worsening symptoms Recommended repeat testing in 3 months with positive results. Recommended condom use with all sex  Patient is currently using nothing or condoms to prevent pregnancy.    Return if symptoms worsen or fail to improve.  No future appointments.  Marline Backbone, FNP

## 2022-04-08 DIAGNOSIS — B182 Chronic viral hepatitis C: Secondary | ICD-10-CM | POA: Insufficient documentation

## 2022-04-08 LAB — HM HIV SCREENING LAB: HM HIV Screening: NEGATIVE

## 2022-04-08 LAB — HM HEPATITIS C SCREENING LAB: HM Hepatitis Screen: POSITIVE

## 2022-05-16 ENCOUNTER — Telehealth: Payer: Self-pay

## 2022-05-16 NOTE — Telephone Encounter (Signed)
Attempted to contact pt regarding positive Hep C test results that were drawn on 03/28/2022. Per results indicates active infection and pt should be referred to Hep C treatment provider. Unable to leave msg. Recording on phone said unable to receive calls at this time.

## 2022-05-19 ENCOUNTER — Telehealth: Payer: Self-pay

## 2022-05-19 NOTE — Telephone Encounter (Signed)
2nd attempt to contact pt regarding Hep C test results that were drawn 03/28/22. Number called [phone]-recording said subscriber unavailable,not in service. Unable to leave a msg.

## 2022-05-19 NOTE — Telephone Encounter (Signed)
2nd attempt to call pt regarding Hepatitis C test results drawn on 03/28/2022. Recording says this subscriber is unavailable, not working number. Unable to leave msg.
